# Patient Record
Sex: Female | Born: 1965
Health system: Southern US, Community
[De-identification: ages and names within clinical notes are randomized; demographics above are authoritative.]

## PROBLEM LIST (undated history)

## (undated) DIAGNOSIS — E079 Disorder of thyroid, unspecified: Secondary | ICD-10-CM

## (undated) DIAGNOSIS — T7840XA Allergy, unspecified, initial encounter: Secondary | ICD-10-CM

## (undated) DIAGNOSIS — E785 Hyperlipidemia, unspecified: Secondary | ICD-10-CM

## (undated) DIAGNOSIS — J329 Chronic sinusitis, unspecified: Secondary | ICD-10-CM

## (undated) DIAGNOSIS — K649 Unspecified hemorrhoids: Secondary | ICD-10-CM

## (undated) DIAGNOSIS — K219 Gastro-esophageal reflux disease without esophagitis: Secondary | ICD-10-CM

## (undated) DIAGNOSIS — I10 Essential (primary) hypertension: Secondary | ICD-10-CM

## (undated) DIAGNOSIS — J42 Unspecified chronic bronchitis: Secondary | ICD-10-CM

## (undated) HISTORY — DX: Gastro-esophageal reflux disease without esophagitis: K21.9

## (undated) HISTORY — DX: Allergy, unspecified, initial encounter: T78.40XA

## (undated) HISTORY — PX: TOTAL ABDOMINAL HYSTERECTOMY: SHX209

## (undated) HISTORY — DX: Essential (primary) hypertension: I10

## (undated) HISTORY — DX: Unspecified hemorrhoids: K64.9

## (undated) HISTORY — DX: Chronic sinusitis, unspecified: J32.9

## (undated) HISTORY — DX: Disorder of thyroid, unspecified: E07.9

## (undated) HISTORY — PX: OTHER SURGICAL HISTORY: SHX169

## (undated) HISTORY — PX: HEMORROIDECTOMY: SUR656

## (undated) HISTORY — DX: Unspecified chronic bronchitis: J42

## (undated) HISTORY — DX: Hyperlipidemia, unspecified: E78.5

---

## 2004-10-02 ENCOUNTER — Ambulatory Visit: Payer: Self-pay | Admitting: Family Medicine

## 2004-10-09 ENCOUNTER — Ambulatory Visit: Payer: Self-pay | Admitting: Family Medicine

## 2004-12-04 ENCOUNTER — Ambulatory Visit: Payer: Self-pay | Admitting: Family Medicine

## 2005-02-15 ENCOUNTER — Ambulatory Visit: Payer: Self-pay | Admitting: Family Medicine

## 2005-03-09 ENCOUNTER — Ambulatory Visit: Payer: Self-pay | Admitting: Family Medicine

## 2005-03-30 ENCOUNTER — Ambulatory Visit: Payer: Self-pay | Admitting: Family Medicine

## 2005-04-13 ENCOUNTER — Ambulatory Visit: Payer: Self-pay | Admitting: Family Medicine

## 2005-05-04 ENCOUNTER — Ambulatory Visit: Payer: Self-pay | Admitting: Family Medicine

## 2005-05-21 ENCOUNTER — Ambulatory Visit: Payer: Self-pay | Admitting: Family Medicine

## 2005-06-24 ENCOUNTER — Ambulatory Visit: Payer: Self-pay | Admitting: Family Medicine

## 2005-07-19 ENCOUNTER — Ambulatory Visit: Payer: Self-pay | Admitting: Family Medicine

## 2005-08-09 ENCOUNTER — Ambulatory Visit: Payer: Self-pay | Admitting: Family Medicine

## 2005-09-15 ENCOUNTER — Ambulatory Visit: Payer: Self-pay | Admitting: Family Medicine

## 2005-09-20 ENCOUNTER — Ambulatory Visit: Payer: Self-pay | Admitting: Internal Medicine

## 2007-11-14 ENCOUNTER — Encounter: Admission: RE | Admit: 2007-11-14 | Discharge: 2007-11-29 | Payer: Self-pay | Admitting: Orthopedic Surgery

## 2007-11-30 ENCOUNTER — Encounter: Admission: RE | Admit: 2007-11-30 | Discharge: 2008-01-05 | Payer: Self-pay | Admitting: Orthopedic Surgery

## 2010-10-15 ENCOUNTER — Encounter
Admission: RE | Admit: 2010-10-15 | Discharge: 2010-11-26 | Payer: Self-pay | Source: Home / Self Care | Attending: *Deleted | Admitting: *Deleted

## 2010-11-29 ENCOUNTER — Encounter
Admission: RE | Admit: 2010-11-29 | Discharge: 2010-12-29 | Payer: Self-pay | Source: Home / Self Care | Attending: *Deleted | Admitting: *Deleted

## 2011-04-16 NOTE — Consult Note (Signed)
Candice Jackson NO.:  [id number]   MEDICAL RECORD NO.:  [id number]           PATIENT TYPE:   LOCATION:                                 FACILITY:   PHYSICIAN:  R. Roetta Sessions, M.D. DATE OF BIRTH:  1966/09/28   DATE OF CONSULTATION:  09/20/2005  DATE OF DISCHARGE:                                   CONSULTATION   REASON FOR CONSULTATION:  Hemorrhoids.   HISTORY OF PRESENT ILLNESS:  The patient is a 45 year old Caucasian female  who presents today for further evaluation of constipation and hemorrhoids.  She has had constipation over several months duration.  She has been placed  on stool softener and fiber supplements with good results, and she was  having 2-3 soft stools daily.  However, during the summertime, when she was  having significant constipation, she was noting rectal pain due to  hemorrhoids.  She was also having hematochezia.  Continues to have  hematochezia at this point.  The last time she was blood per rectum was last  week.  The blood has been mixed in with stool.  Denies any melena.  Her  hemorrhoids did not seem to be quite as tender at this point.  She had used  various over-the-counter preparations as well as Anusol HC suppositories and  creams with decent results.  Denies any abdominal pain, nausea, vomiting,  heartburn, dysphagia, odynophagia.   MEDICATIONS:  1.  Tussionex one teaspoon q.h.s.  2.  Benicar 20 mg daily.  3.  Levaquin 750 mg daily.  4.  Stool softeners p.r.n.   ALLERGIES:  Ketek.   PAST MEDICAL HISTORY:  Hypertension, currently on Levaquin for sinusitis.   PAST SURGICAL HISTORY:  Hysterectomy.   FAMILY HISTORY:  Mother died of brain tumor age 27.  Father died of stroke  and heart disease age 60.  No family history of colorectal cancer.   SOCIAL HISTORY:  She is married and has two children.  She is employed with  [employer].  She smokes one pack of cigarettes daily.  She consumes about  three beers on the  weekend.   REVIEW OF SYSTEMS:  See HPI for GI.  CARDIOPULMONARY:  Denies any chest  pain, shortness of breath or palpitations.   PHYSICAL EXAMINATION:  VITAL SIGNS:  Weight 123, height 5 feet, temperature  97.7, blood pressure 118/64, pulse 74.  GENERAL APPEARANCE:  Pleasant, well-developed, well-nourished, Caucasian  female in no acute distress.  SKIN:  Warm and dry, no jaundice.  HEENT:  Pupils equal, round and reactive to light.  Conjunctivae are pink.  Sclerae are nonicteric. Oropharyngeal mucosa moist and pink.  No lesions,  erythema or exudate.  No lymphadenopathy, thyromegaly.  CHEST:  Lungs are clear to auscultation.  CARDIAC:  Regular rate and rhythm.  Normal S1, S2.  No murmurs, rubs or  gallops.  ABDOMEN:  Positive bowel sounds.  Soft, nontender, nondistended.  No  organomegaly or masses.  No rebound tenderness or guarding.  No abdominal  bruits or hernias.  EXTREMITIES:  No edema.   IMPRESSION:  Candice Jackson is a 45 year old lady with history of  constipation  associated with hematochezia and rectal pain, felt to be the hemorrhoids.  Her hematochezia may be based on a benign anal rectal source.  However,  cannot exclude other etiologies such as polyps, etc.  Given he chronicity of  her hematochezia, I offered her a colonoscopy at this time for further  evaluation.  With regards to her hemorrhoid pain, this is improved with  Anusol suppositories/creams and stool softeners.   PLAN:  1.  Colonoscopy in he near future.  2.  Recommended continuing stool softeners and fiber in order to keep her      bowel movements regular and soft.  3.  AnaMantle HC 2.5% gel, #10 samples provided to apply ano rectally b.i.d.      p.r.n.  4.  Further recommendations to follow.      Candice Jackson, P.AJonathon Bellows, M.D.  Electronically Signed    LL/MEDQ  D:  09/20/2005  T:  09/21/2005  Job:  [id number]

## 2012-03-27 ENCOUNTER — Encounter: Payer: Self-pay | Admitting: Pulmonary Disease

## 2012-03-28 ENCOUNTER — Ambulatory Visit (INDEPENDENT_AMBULATORY_CARE_PROVIDER_SITE_OTHER)
Admission: RE | Admit: 2012-03-28 | Discharge: 2012-03-28 | Disposition: A | Discharge status: Home / Self Care | Payer: BC Managed Care – PPO | Source: Ambulatory Visit | Attending: Pulmonary Disease | Admitting: Pulmonary Disease

## 2012-03-28 ENCOUNTER — Ambulatory Visit (INDEPENDENT_AMBULATORY_CARE_PROVIDER_SITE_OTHER): Payer: BC Managed Care – PPO | Admitting: Pulmonary Disease

## 2012-03-28 ENCOUNTER — Encounter: Payer: Self-pay | Admitting: Pulmonary Disease

## 2012-03-28 ENCOUNTER — Encounter: Payer: Self-pay | Admitting: *Deleted

## 2012-03-28 VITALS — BP 124/82 | HR 96 | Temp 97.8°F | Ht 60.0 in | Wt 139.4 lb

## 2012-03-28 DIAGNOSIS — J45909 Unspecified asthma, uncomplicated: Secondary | ICD-10-CM

## 2012-03-28 NOTE — Patient Instructions (Signed)
Will send for a cxr today, and will call you with results Will set up for breathing studies, and see you back same day to review.  Depending upon results, may need to start you on an inhaler. Work hard on smoking cessation.  This is the key to you staying well.

## 2012-03-28 NOTE — Progress Notes (Signed)
Addended by: Salli Quarry on: 03/28/2012 10:14 AM   Modules accepted: Orders

## 2012-03-28 NOTE — Assessment & Plan Note (Signed)
The patient is describing recurrent episodes of asthmatic bronchitis that required an antibiotic and course of prednisone.  I suspect all of this is due to her ongoing smoking, although chronic sinus disease could be playing a role.  It is unclear whether she has fixed obstructive lung disease, and will need full pulmonary function studies for evaluation.  She really does not have a lot of dyspnea on exertion.  I have stressed to her the role that ongoing smoking plays in airway inflammation, and how it predisposes her to recurrent sinopulmonary infections.  She is likely to have ongoing issues unless she can totally stop smoking.  Will check a chest x-ray for completeness, and see her back after her pulmonary function studies to discuss further treatments.

## 2012-03-28 NOTE — Progress Notes (Signed)
  Subjective:    Patient ID: Candice Jackson, female    DOB: 07-Jun-1966, 46 y.o.   MRN: [id number]  HPI The patient is a 46 year old female who been asked to see for recurrent sinopulmonary infections.  These have been going on for approximately 2 years, with the last requiring antibiotics and prednisone in about 4 weeks ago.  These always start in her head, and then moves down into her chest.  They have been occurring approximately every 2 months.  It should be noted the patient has a long history of tobacco abuse, and continues to smoke.  She has a chronic cough with white foamy mucus production when she is not sick.  Surprisingly, she does not have a lot of dyspnea on exertion.  She can bring groceries in from the car, do her housework, and walk up one flight of stairs without becoming winded.  She has no past history of asthma.  She tells me that she had a sinus x-ray approximately 5 years ago and it was clear.  She has a history of reflux for which she is on medication with adequate control of symptoms.  She has not had a chest x-ray or pulmonary function studies.   Review of Systems  Constitutional: Negative for fever and unexpected weight change.  HENT: Positive for ear pain. Negative for nosebleeds, congestion, sore throat, rhinorrhea, sneezing, trouble swallowing, dental problem, postnasal drip and sinus pressure.   Eyes: Negative for redness and itching.  Respiratory: Positive for cough and shortness of breath. Negative for chest tightness and wheezing.   Cardiovascular: Negative for palpitations and leg swelling.  Gastrointestinal: Negative for nausea and vomiting.  Genitourinary: Negative for dysuria.  Musculoskeletal: Negative for joint swelling.  Skin: Positive for rash.  Neurological: Negative for headaches.  Hematological: Does not bruise/bleed easily.  Psychiatric/Behavioral: Negative for dysphoric mood. The patient is nervous/anxious.        Objective:   Physical  Exam Constitutional:  Well developed, no acute distress  HENT:  Nares patent without discharge, ?polyp left nare  Oropharynx without exudate, palate and uvula are normal  Eyes:  Perrla, eomi, no scleral icterus  Neck:  No JVD, no TMG  Cardiovascular:  Normal rate, regular rhythm, no rubs or gallops.  No murmurs        Intact distal pulses  Pulmonary :  Normal breath sounds, no stridor or respiratory distress   No rales, rhonchi, or wheezing  Abdominal:  Soft, nondistended, bowel sounds present.  No tenderness noted.   Musculoskeletal:  No lower extremity edema noted.  Lymph Nodes:  No cervical lymphadenopathy noted  Skin:  No cyanosis noted  Neurologic:  Alert, appropriate, moves all 4 extremities without obvious deficit.         Assessment & Plan:

## 2012-03-30 ENCOUNTER — Telehealth: Payer: Self-pay | Admitting: Pulmonary Disease

## 2012-03-30 NOTE — Telephone Encounter (Signed)
Notes Recorded by Barbaraann Share, MD on 03/28/2012 at 5:14 PM Please let pt know that cxr is clear.   I spoke with patient about results and she verbalized understanding and had no questions

## 2012-04-12 ENCOUNTER — Encounter: Payer: Self-pay | Admitting: Pulmonary Disease

## 2012-04-12 ENCOUNTER — Encounter (INDEPENDENT_AMBULATORY_CARE_PROVIDER_SITE_OTHER): Payer: BC Managed Care – PPO | Admitting: Pulmonary Disease

## 2012-04-12 ENCOUNTER — Ambulatory Visit (INDEPENDENT_AMBULATORY_CARE_PROVIDER_SITE_OTHER): Payer: BC Managed Care – PPO | Admitting: Pulmonary Disease

## 2012-04-12 VITALS — BP 112/74 | HR 102 | Temp 97.9°F | Ht 60.0 in | Wt 138.0 lb

## 2012-04-12 DIAGNOSIS — R0602 Shortness of breath: Secondary | ICD-10-CM

## 2012-04-12 DIAGNOSIS — J45909 Unspecified asthma, uncomplicated: Secondary | ICD-10-CM

## 2012-04-12 LAB — PULMONARY FUNCTION TEST

## 2012-04-12 NOTE — Assessment & Plan Note (Signed)
The patient has normal PFTs today, and therefore does not have COPD.  I have told her that if she is able to totally stop smoking, she will not require any inhaler.  However, she continues to smoke, I expect her to have ongoing sinopulmonary infections on a frequent basis.  If she is unable to quit smoking, could consider a LABA/ICS inhaler in order to reduce airway inflammation and possibly decrease her flareups.  I will leave that to the patient and her primary care physician.  The patient needs no further pulmonary followup at this time.

## 2012-04-12 NOTE — Progress Notes (Signed)
  Subjective:    Patient ID: Candice Jackson, female    DOB: [birth year], 46 y.o.   MRN: [id number]  HPI Patient comes in today for followup of her recent pulmonary function studies.  She was found to have no airflow obstruction, no restrictions, and essentially normal diffusion capacity.  Her chest x-ray last visit was clear as well.  I had a long discussion with the patient about her breathing studies, and answered all of her questions.   Review of Systems  Constitutional: Negative.  Negative for fever and unexpected weight change.  HENT: Positive for congestion. Negative for ear pain, nosebleeds, sore throat, rhinorrhea, sneezing, trouble swallowing, dental problem, postnasal drip and sinus pressure.   Eyes: Negative.  Negative for redness and itching.  Respiratory: Positive for cough and wheezing. Negative for chest tightness and shortness of breath.   Cardiovascular: Negative.  Negative for palpitations and leg swelling.  Gastrointestinal: Negative.  Negative for nausea and vomiting.  Genitourinary: Negative.  Negative for dysuria.  Musculoskeletal: Negative.  Negative for joint swelling.  Skin: Negative.  Negative for rash.  Neurological: Negative.  Negative for headaches.  Hematological: Negative.  Does not bruise/bleed easily.  Psychiatric/Behavioral: Negative.  Negative for dysphoric mood. The patient is not nervous/anxious.        Objective:   Physical Exam Well-developed female in no acute distress Nose without purulence or discharge noted Chest clear to auscultation Cardiac exam with regular rate and rhythm Lower extremities without edema, no cyanosis Alert and oriented, moves all 4 extremities.       Assessment & Plan:

## 2012-04-12 NOTE — Patient Instructions (Signed)
The good news is that you do not have copd. You must stop smoking if you wish to stay well. You may benefit from an inhaler such as symbicort or dulera if you are unable to stop smoking and keep having flareups.  I will leave that decision to your primary md.  If you stop smoking, you will not need any breathing medication. followup with me as needed.

## 2015-03-04 ENCOUNTER — Ambulatory Visit: Payer: Self-pay

## 2015-03-04 ENCOUNTER — Other Ambulatory Visit: Payer: Self-pay | Admitting: Occupational Medicine

## 2015-03-04 DIAGNOSIS — M25531 Pain in right wrist: Secondary | ICD-10-CM

## 2015-03-04 DIAGNOSIS — M79641 Pain in right hand: Secondary | ICD-10-CM

## 2016-08-05 ENCOUNTER — Other Ambulatory Visit: Payer: Self-pay | Admitting: Physician Assistant

## 2016-08-16 ENCOUNTER — Other Ambulatory Visit: Payer: Self-pay | Admitting: Physician Assistant

## 2018-06-15 ENCOUNTER — Other Ambulatory Visit (HOSPITAL_COMMUNITY): Payer: Self-pay | Admitting: Pulmonary Disease

## 2018-06-15 ENCOUNTER — Ambulatory Visit (HOSPITAL_COMMUNITY)
Admission: RE | Admit: 2018-06-15 | Discharge: 2018-06-15 | Disposition: A | Discharge status: Home / Self Care | Payer: 59 | Source: Ambulatory Visit | Attending: Pulmonary Disease | Admitting: Pulmonary Disease

## 2018-06-15 DIAGNOSIS — R918 Other nonspecific abnormal finding of lung field: Secondary | ICD-10-CM | POA: Insufficient documentation

## 2018-06-15 DIAGNOSIS — R05 Cough: Secondary | ICD-10-CM

## 2018-06-15 DIAGNOSIS — R059 Cough, unspecified: Secondary | ICD-10-CM

## 2018-06-15 DIAGNOSIS — J9811 Atelectasis: Secondary | ICD-10-CM | POA: Insufficient documentation

## 2018-08-07 DIAGNOSIS — I10 Essential (primary) hypertension: Secondary | ICD-10-CM | POA: Insufficient documentation

## 2018-08-07 DIAGNOSIS — E039 Hypothyroidism, unspecified: Secondary | ICD-10-CM | POA: Insufficient documentation

## 2018-08-07 DIAGNOSIS — J449 Chronic obstructive pulmonary disease, unspecified: Secondary | ICD-10-CM | POA: Insufficient documentation

## 2018-08-07 DIAGNOSIS — K219 Gastro-esophageal reflux disease without esophagitis: Secondary | ICD-10-CM | POA: Insufficient documentation

## 2018-09-15 ENCOUNTER — Ambulatory Visit (INDEPENDENT_AMBULATORY_CARE_PROVIDER_SITE_OTHER): Payer: 59 | Admitting: Internal Medicine

## 2018-09-25 ENCOUNTER — Encounter (INDEPENDENT_AMBULATORY_CARE_PROVIDER_SITE_OTHER): Payer: Self-pay | Admitting: Internal Medicine

## 2018-09-25 ENCOUNTER — Ambulatory Visit (INDEPENDENT_AMBULATORY_CARE_PROVIDER_SITE_OTHER): Payer: 59 | Admitting: Internal Medicine

## 2018-09-25 VITALS — BP 160/80 | HR 64 | Temp 98.1°F | Ht 60.0 in | Wt 129.6 lb

## 2018-09-25 DIAGNOSIS — K219 Gastro-esophageal reflux disease without esophagitis: Secondary | ICD-10-CM | POA: Diagnosis not present

## 2018-09-25 NOTE — Patient Instructions (Addendum)
Gastroesophageal Reflux Disease, Adult Normally, food travels down the esophagus and stays in the stomach to be digested. If a person has gastroesophageal reflux disease (GERD), food and stomach acid move back up into the esophagus. When this happens, the esophagus becomes sore and swollen (inflamed). Over time, GERD can make small holes (ulcers) in the lining of the esophagus. Follow these instructions at home: Diet  Follow a diet as told by your doctor. You may need to avoid foods and drinks such as: ? Coffee and tea (with or without caffeine). ? Drinks that contain alcohol. ? Energy drinks and sports drinks. ? Carbonated drinks or sodas. ? Chocolate and cocoa. ? Peppermint and mint flavorings. ? Garlic and onions. ? Horseradish. ? Spicy and acidic foods, such as peppers, chili powder, curry powder, vinegar, hot sauces, and BBQ sauce. ? Citrus fruit juices and citrus fruits, such as oranges, lemons, and limes. ? Tomato-based foods, such as red sauce, chili, salsa, and pizza with red sauce. ? Fried and fatty foods, such as donuts, french fries, potato chips, and high-fat dressings. ? High-fat meats, such as hot dogs, rib eye steak, sausage, ham, and bacon. ? High-fat dairy items, such as whole milk, butter, and cream cheese.  Eat small meals often. Avoid eating large meals.  Avoid drinking large amounts of liquid with your meals.  Avoid eating meals during the 2-3 hours before bedtime.  Avoid lying down right after you eat.  Do not exercise right after you eat. General instructions  Pay attention to any changes in your symptoms.  Take over-the-counter and prescription medicines only as told by your doctor. Do not take aspirin, ibuprofen, or other NSAIDs unless your doctor says it is okay.  Do not use any tobacco products, including cigarettes, chewing tobacco, and e-cigarettes. If you need help quitting, ask your doctor.  Wear loose clothes. Do not wear anything tight around  your waist.  Raise (elevate) the head of your bed about 6 inches (15 cm).  Try to lower your stress. If you need help doing this, ask your doctor.  If you are overweight, lose an amount of weight that is healthy for you. Ask your doctor about a safe weight loss goal.  Keep all follow-up visits as told by your doctor. This is important. Contact a doctor if:  You have new symptoms.  You lose weight and you do not know why it is happening.  You have trouble swallowing, or it hurts to swallow.  You have wheezing or a cough that keeps happening.  Your symptoms do not get better with treatment.  You have a hoarse voice. Get help right away if:  You have pain in your arms, neck, jaw, teeth, or back.  You feel sweaty, dizzy, or light-headed.  You have chest pain or shortness of breath.  You throw up (vomit) and your throw up looks like blood or coffee grounds.  You pass out (faint).  Your poop (stool) is bloody or black.  You cannot swallow, drink, or eat. This information is not intended to replace advice given to you by your health care provider. Make sure you discuss any questions you have with your health care provider. Document Released: 05/03/2008 Document Revised: 04/22/2016 Document Reviewed: 03/12/2015 Elsevier Interactive Patient Education  [employer].   Stop the Motrin. Omeprazole 20mg  BID.

## 2018-09-25 NOTE — Progress Notes (Signed)
   Subjective:    Patient ID: Candice Jackson, female    DOB: Feb 12, 1966, 52 y.o.   MRN: [id number]  HPI Referred by Hinda Glatter PA for nausea. She states hasn't been able to keep anything on her stomach. She says she feels better since starting the Omeprazole. She was taking Omeprazole twice a day, but now is down to once a day. She is 80% better.  GERD controlled with Omeprazole. She recently had her Synthroid increased. States after the increase, she began to feel better.   Has been taking Motrin 600mg  hs for a long time. Her appetite is better. No weight loss.  She has a BM x 1 a day. No melena or BRRB. She says she cannot eat eggs now. She says she will vomit the eggs back up. She denies any epigastric pain.   08/07/2018 H and H 132. And 38.5.   Review of Systems Past Medical History:  Diagnosis Date  . Allergic rhinitis   . Constipation   . GERD (gastroesophageal reflux disease)   . Hemorrhoid   . HTN (hypertension)   . Hyperlipidemia   . Sinusitis     Past Surgical History:  Procedure Laterality Date  . HEMORROIDECTOMY    . R ankle surgery    . TOTAL ABDOMINAL HYSTERECTOMY      Allergies  Allergen Reactions  . Amoxicillin-Pot Clavulanate Nausea And Vomiting  . Ketek [Telithromycin] Palpitations  . Statins Rash    Current Outpatient Medications on File Prior to Visit  Medication Sig Dispense Refill  . albuterol (PROAIR HFA) 108 (90 BASE) MCG/ACT inhaler Inhale 2 puffs into the lungs every 6 (six) hours as needed.    Marland Kitchen ibuprofen (ADVIL,MOTRIN) 600 MG tablet Take 600 mg by mouth at bedtime.    Marland Kitchen levothyroxine (SYNTHROID) 125 MCG tablet Take 125 mcg by mouth daily before breakfast.    . Nebivolol HCl (BYSTOLIC) 20 MG TABS Take by mouth.    Marland Kitchen omeprazole (PRILOSEC) 20 MG capsule Take 20 mg by mouth daily.    . SYMBICORT 80-4.5 MCG/ACT inhaler USE 2 INHALATIONS TWICE A DAY 30.6 g 3   No current facility-administered medications on file prior to visit.           Objective:   Physical Exam  Blood pressure (!) 160/80, pulse 64, temperature 98.1 F (36.7 C), height 5' (1.524 m), weight 129 lb 9.6 oz (58.8 kg). Alert and oriented. Skin warm and dry. Oral mucosa is moist.   . Sclera anicteric, conjunctivae is pink. Thyroid not enlarged. No cervical lymphadenopathy. Lungs clear. Heart regular rate and rhythm.  Abdomen is soft. Bowel sounds are positive. No hepatomegaly. No abdominal masses felt. No tenderness.  No edema to lower extremities.          Assessment & Plan:  GERD. She will continue the Omeprazole twice a day. Stop the Motrin.  OV in 3 months.

## 2018-12-26 ENCOUNTER — Ambulatory Visit (INDEPENDENT_AMBULATORY_CARE_PROVIDER_SITE_OTHER): Payer: 59 | Admitting: Internal Medicine

## 2018-12-26 ENCOUNTER — Encounter (INDEPENDENT_AMBULATORY_CARE_PROVIDER_SITE_OTHER): Payer: Self-pay | Admitting: Internal Medicine

## 2018-12-26 ENCOUNTER — Encounter (INDEPENDENT_AMBULATORY_CARE_PROVIDER_SITE_OTHER): Payer: Self-pay | Admitting: *Deleted

## 2018-12-26 VITALS — BP 144/86 | HR 80 | Temp 98.0°F | Ht 60.0 in | Wt 127.4 lb

## 2018-12-26 DIAGNOSIS — R112 Nausea with vomiting, unspecified: Secondary | ICD-10-CM | POA: Diagnosis not present

## 2018-12-26 NOTE — Patient Instructions (Signed)
Will get an US abdomen, CBC and Hepatic.

## 2018-12-26 NOTE — Progress Notes (Signed)
   Subjective:    Patient ID: Candice Jackson, female    DOB: 12-09-1965, 53 y.o.   MRN: [id number]  HPI Here today for f/u. Last seen in October as a new referral for nausea. Started to feel better after starting the Omeprazole. She was 80% better. Also stated once her Synthroid was increased she felt better. She tells me she vomits everyday.  She does have acid reflux. She wants to be switched to the brand name for Omeprazole.  Her appetite is okay. She has lost about 1 1/2 pounds. She vomits after each meal. She says however she can eat junk foods and snack and does not vomit. She can drink beer without any problem. She drinks about 5-6 beers a day.  No problems with her BMs.  Takes one Motrin 600mg  at hs for left shoulder pain (arthritis)    Review of Systems Past Medical History:  Diagnosis Date  . Allergic rhinitis   . Constipation   . GERD (gastroesophageal reflux disease)   . Hemorrhoid   . HTN (hypertension)   . Hyperlipidemia   . Sinusitis     Past Surgical History:  Procedure Laterality Date  . HEMORROIDECTOMY    . R ankle surgery    . TOTAL ABDOMINAL HYSTERECTOMY      Allergies  Allergen Reactions  . Amoxicillin-Pot Clavulanate Nausea And Vomiting  . Ketek [Telithromycin] Palpitations  . Statins Rash    Current Outpatient Medications on File Prior to Visit  Medication Sig Dispense Refill  . albuterol (PROAIR HFA) 108 (90 BASE) MCG/ACT inhaler Inhale 2 puffs into the lungs every 6 (six) hours as needed.    Marland Kitchen ibuprofen (ADVIL,MOTRIN) 600 MG tablet Take 600 mg by mouth at bedtime.    Marland Kitchen levothyroxine (SYNTHROID) 125 MCG tablet Take 125 mcg by mouth daily before breakfast.    . Nebivolol HCl (BYSTOLIC) 20 MG TABS Take by mouth.    Marland Kitchen omeprazole (PRILOSEC) 20 MG capsule Take 20 mg by mouth daily.    . SYMBICORT 80-4.5 MCG/ACT inhaler USE 2 INHALATIONS TWICE A DAY 30.6 g 3   No current facility-administered medications on file prior to visit.           Objective:   Physical Exam Blood pressure (!) 144/86, pulse 80, temperature 98 F (36.7 C), height 5' (1.524 m), weight 127 lb 6.4 oz (57.8 kg). Alert and oriented. Skin warm and dry. Oral mucosa is moist.   . Sclera anicteric, conjunctivae is pink. Thyroid not enlarged. No cervical lymphadenopathy. Lungs clear. Heart regular rate and rhythm.  Abdomen is soft. Bowel sounds are positive. No hepatomegaly. No abdominal masses felt. No tenderness.  No edema to lower extremities.           Assessment & Plan:  Nausea and vomiting. Am going to get a CBC, Hepatic, and an US abdomen.  Further recommendations to follow. May need a HIDA scan.

## 2018-12-27 LAB — HEPATIC FUNCTION PANEL
AG RATIO: 1.5 (calc) (ref 1.0–2.5)
ALT: 25 U/L (ref 6–29)
AST: 22 U/L (ref 10–35)
Albumin: 4.4 g/dL (ref 3.6–5.1)
Alkaline phosphatase (APISO): 91 U/L (ref 33–130)
BILIRUBIN TOTAL: 0.5 mg/dL (ref 0.2–1.2)
Bilirubin, Direct: 0.1 mg/dL (ref 0.0–0.2)
GLOBULIN: 3 g/dL (ref 1.9–3.7)
Indirect Bilirubin: 0.4 mg/dL (calc) (ref 0.2–1.2)
TOTAL PROTEIN: 7.4 g/dL (ref 6.1–8.1)

## 2018-12-27 LAB — CBC WITH DIFFERENTIAL/PLATELET
Absolute Monocytes: 599 cells/uL (ref 200–950)
BASOS ABS: 68 {cells}/uL (ref 0–200)
Basophils Relative: 1.2 %
EOS ABS: 228 {cells}/uL (ref 15–500)
Eosinophils Relative: 4 %
HCT: 40.8 % (ref 35.0–45.0)
Hemoglobin: 14.2 g/dL (ref 11.7–15.5)
Lymphs Abs: 2423 cells/uL (ref 850–3900)
MCH: 31.8 pg (ref 27.0–33.0)
MCHC: 34.8 g/dL (ref 32.0–36.0)
MCV: 91.3 fL (ref 80.0–100.0)
MONOS PCT: 10.5 %
MPV: 9.9 fL (ref 7.5–12.5)
NEUTROS PCT: 41.8 %
Neutro Abs: 2383 cells/uL (ref 1500–7800)
Platelets: 264 10*3/uL (ref 140–400)
RBC: 4.47 10*6/uL (ref 3.80–5.10)
RDW: 12.7 % (ref 11.0–15.0)
TOTAL LYMPHOCYTE: 42.5 %
WBC: 5.7 10*3/uL (ref 3.8–10.8)

## 2019-01-01 ENCOUNTER — Ambulatory Visit (HOSPITAL_COMMUNITY)
Admission: RE | Admit: 2019-01-01 | Discharge: 2019-01-01 | Disposition: A | Discharge status: Home / Self Care | Payer: 59 | Source: Ambulatory Visit | Attending: Internal Medicine | Admitting: Internal Medicine

## 2019-01-01 DIAGNOSIS — R112 Nausea with vomiting, unspecified: Secondary | ICD-10-CM

## 2019-01-23 ENCOUNTER — Encounter (INDEPENDENT_AMBULATORY_CARE_PROVIDER_SITE_OTHER): Payer: Self-pay | Admitting: *Deleted

## 2019-01-29 ENCOUNTER — Telehealth (INDEPENDENT_AMBULATORY_CARE_PROVIDER_SITE_OTHER): Payer: Self-pay | Admitting: Internal Medicine

## 2019-01-29 NOTE — Telephone Encounter (Signed)
Patient called regarding CT results  -  Ph# [phone]

## 2019-01-31 ENCOUNTER — Telehealth (INDEPENDENT_AMBULATORY_CARE_PROVIDER_SITE_OTHER): Payer: Self-pay | Admitting: Internal Medicine

## 2019-01-31 DIAGNOSIS — K7469 Other cirrhosis of liver: Secondary | ICD-10-CM

## 2019-01-31 DIAGNOSIS — R112 Nausea with vomiting, unspecified: Secondary | ICD-10-CM

## 2019-01-31 NOTE — Telephone Encounter (Signed)
Ann, CT abdomen. ] N and Vomiting cirrhosis

## 2019-01-31 NOTE — Telephone Encounter (Signed)
Message left on answering machine 

## 2019-01-31 NOTE — Telephone Encounter (Signed)
Results given to patient. CT ordered

## 2019-01-31 NOTE — Telephone Encounter (Signed)
Patient called regarding results from her [place]

## 2019-02-01 ENCOUNTER — Telehealth (INDEPENDENT_AMBULATORY_CARE_PROVIDER_SITE_OTHER): Payer: Self-pay | Admitting: Internal Medicine

## 2019-02-01 ENCOUNTER — Other Ambulatory Visit (INDEPENDENT_AMBULATORY_CARE_PROVIDER_SITE_OTHER): Payer: Self-pay | Admitting: Internal Medicine

## 2019-02-01 ENCOUNTER — Telehealth (INDEPENDENT_AMBULATORY_CARE_PROVIDER_SITE_OTHER): Payer: Self-pay | Admitting: *Deleted

## 2019-02-01 DIAGNOSIS — K7469 Other cirrhosis of liver: Secondary | ICD-10-CM

## 2019-02-01 DIAGNOSIS — R112 Nausea with vomiting, unspecified: Secondary | ICD-10-CM

## 2019-02-01 NOTE — Telephone Encounter (Signed)
CT abdomen/w/CM ordered

## 2019-02-01 NOTE — Telephone Encounter (Signed)
Please call patient -- she has questions on what she can and cannot drink or eat for her liver

## 2019-02-01 NOTE — Telephone Encounter (Signed)
CT sch'd 02/13/19 at 1245, npo 4 hrs prior, pick up contrast, patient aware

## 2019-02-01 NOTE — Telephone Encounter (Signed)
I advised her to etoh

## 2019-02-01 NOTE — Addendum Note (Signed)
Addended by: Len Blalock on: 02/01/2019 10:58 AM   Modules accepted: Orders

## 2019-02-13 ENCOUNTER — Other Ambulatory Visit: Payer: Self-pay

## 2019-02-13 ENCOUNTER — Ambulatory Visit (HOSPITAL_COMMUNITY)
Admission: RE | Admit: 2019-02-13 | Discharge: 2019-02-13 | Disposition: A | Discharge status: Home / Self Care | Payer: 59 | Source: Ambulatory Visit | Attending: Internal Medicine | Admitting: Internal Medicine

## 2019-02-13 DIAGNOSIS — K7469 Other cirrhosis of liver: Secondary | ICD-10-CM | POA: Diagnosis present

## 2019-02-13 DIAGNOSIS — R112 Nausea with vomiting, unspecified: Secondary | ICD-10-CM | POA: Insufficient documentation

## 2019-02-13 LAB — POCT I-STAT CREATININE: Creatinine, Ser: 0.8 mg/dL (ref 0.44–1.00)

## 2019-02-13 MED ORDER — IOHEXOL 300 MG/ML  SOLN
100.0000 mL | Freq: Once | INTRAMUSCULAR | Status: AC | PRN
  Administered 2019-02-13: 100 mL via INTRAVENOUS

## 2019-10-01 ENCOUNTER — Ambulatory Visit (INDEPENDENT_AMBULATORY_CARE_PROVIDER_SITE_OTHER): Payer: 59 | Admitting: Family Medicine

## 2019-10-01 ENCOUNTER — Other Ambulatory Visit: Payer: Self-pay

## 2019-10-01 ENCOUNTER — Encounter: Payer: Self-pay | Admitting: Family Medicine

## 2019-10-01 VITALS — BP 118/77 | HR 72 | Temp 98.0°F | Resp 20 | Ht 60.0 in | Wt 130.0 lb

## 2019-10-01 DIAGNOSIS — I10 Essential (primary) hypertension: Secondary | ICD-10-CM

## 2019-10-01 DIAGNOSIS — F411 Generalized anxiety disorder: Secondary | ICD-10-CM

## 2019-10-01 DIAGNOSIS — E782 Mixed hyperlipidemia: Secondary | ICD-10-CM

## 2019-10-01 DIAGNOSIS — J418 Mixed simple and mucopurulent chronic bronchitis: Secondary | ICD-10-CM | POA: Diagnosis not present

## 2019-10-01 DIAGNOSIS — E039 Hypothyroidism, unspecified: Secondary | ICD-10-CM | POA: Diagnosis not present

## 2019-10-01 DIAGNOSIS — R21 Rash and other nonspecific skin eruption: Secondary | ICD-10-CM

## 2019-10-01 DIAGNOSIS — M25561 Pain in right knee: Secondary | ICD-10-CM

## 2019-10-01 DIAGNOSIS — K219 Gastro-esophageal reflux disease without esophagitis: Secondary | ICD-10-CM

## 2019-10-01 MED ORDER — BUSPIRONE HCL 5 MG PO TABS: 5.0000 mg | ORAL_TABLET | Freq: Three times a day (TID) | ORAL | 3 refills | Status: AC | PRN

## 2019-10-01 MED ORDER — LEVOTHYROXINE SODIUM 125 MCG PO TABS: 125.0000 ug | ORAL_TABLET | Freq: Every day | ORAL | 3 refills | Status: DC

## 2019-10-01 MED ORDER — DICLOFENAC SODIUM 1 % TD GEL: 4.0000 g | Freq: Four times a day (QID) | TRANSDERMAL | 1 refills | Status: DC

## 2019-10-01 MED ORDER — OMEPRAZOLE 20 MG PO CPDR: 20.0000 mg | DELAYED_RELEASE_CAPSULE | Freq: Every day | ORAL | 5 refills | Status: DC

## 2019-10-01 MED ORDER — NYSTATIN-TRIAMCINOLONE 100000-0.1 UNIT/GM-% EX OINT: 1.0000 "application " | TOPICAL_OINTMENT | Freq: Two times a day (BID) | CUTANEOUS | 0 refills | Status: DC

## 2019-10-01 MED ORDER — BYSTOLIC 20 MG PO TABS: 20.0000 mg | ORAL_TABLET | Freq: Every day | ORAL | 5 refills | Status: DC

## 2019-10-01 MED ORDER — SPIRIVA RESPIMAT 1.25 MCG/ACT IN AERS: 1.0000 | INHALATION_SPRAY | Freq: Every day | RESPIRATORY_TRACT | 11 refills | Status: DC

## 2019-10-01 MED ORDER — ALBUTEROL SULFATE HFA 108 (90 BASE) MCG/ACT IN AERS: 2.0000 | INHALATION_SPRAY | Freq: Four times a day (QID) | RESPIRATORY_TRACT | 11 refills | Status: AC | PRN

## 2019-10-01 NOTE — Progress Notes (Signed)
Subjective:  Patient ID: Candice Jackson, female    DOB: 05/13/1966, 53 y.o.   MRN: [id number]  Patient Care Team: Baruch Gouty, FNP as PCP - General (Family Medicine)   Chief Complaint:  Establish Care (NEW )   HPI: Candice Jackson is a 53 y.o. female presenting on 10/01/2019 for Establish Care (NEW )  Pt presents today to establish care. Pt was previously seeing Dr. Edrick Oh who has retired.   Hyperlipidemia This is a chronic problem. The current episode started more than 1 year ago. The problem is uncontrolled. Recent lipid tests were reviewed and are high. Associated symptoms include shortness of breath (exertional). Pertinent negatives include no chest pain, focal sensory loss, focal weakness, leg pain or myalgias. Treatments tried: unable to tolerate statin therapy, not taking anything. Compliance problems include adherence to diet, adherence to exercise and medication side effects.   Hypertension This is a chronic problem. The current episode started more than 1 year ago. The problem is unchanged. The problem is controlled. Associated symptoms include anxiety and shortness of breath (exertional). Pertinent negatives include no blurred vision, chest pain, headaches, malaise/fatigue, neck pain, orthopnea, palpitations, peripheral edema, PND or sweats. 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 The current treatment provides significant improvement. Compliance problems include exercise and diet.  Identifiable causes of hypertension include a thyroid problem.  Thyroid Problem Presents for follow-up visit. Symptoms include cold intolerance. Patient reports no anxiety, constipation, depressed mood, diaphoresis, diarrhea, dry skin, fatigue, hair loss, heat intolerance, hoarse voice, leg swelling, nail problem, palpitations, tremors, visual change, weight gain or weight loss. Her past medical history is significant for hyperlipidemia.  Anxiety Presents for follow-up visit. Symptoms include shortness of  breath (exertional). Patient reports no chest pain, confusion, depressed mood, nausea, nervous/anxious behavior, palpitations or suicidal ideas. Symptoms occur rarely (only takes Xanax as needed for acute anxiety. Has not been on daily medications. Has not needed Xanax in over a year.). The severity of symptoms is mild. The quality of sleep is good. Nighttime awakenings: none.    Cough This is a chronic problem. The current episode started more than 1 year ago. The problem has been waxing and waning. The problem occurs every few hours (was provided with Symbicort and did not tolerate this medication.). The cough is productive of sputum. Associated symptoms include a rash, shortness of breath (exertional) and wheezing. Pertinent negatives include no chest pain, chills, ear congestion, ear pain, fever, headaches, heartburn, hemoptysis, myalgias, nasal congestion, postnasal drip, rhinorrhea, sore throat, sweats or weight loss. Risk factors for lung disease include smoking/tobacco exposure. She has tried a beta-agonist inhaler and steroid inhaler for the symptoms. The treatment provided mild relief. Her past medical history is significant for bronchitis and COPD.  Gastroesophageal Reflux She complains of coughing and wheezing. She reports no abdominal pain, no belching, no chest pain, no choking, no dysphagia, no early satiety, no globus sensation, no heartburn, no hoarse voice, no nausea, no sore throat, no stridor, no tooth decay or no water brash. This is a chronic problem. The current episode started more than 1 year ago. The problem occurs rarely. The problem has been resolved. Pertinent negatives include no anemia, fatigue, melena, muscle weakness, orthopnea or weight loss. She has tried a PPI for the symptoms. The treatment provided significant relief.  Rash This is a new problem. The current episode started more than 1 month ago. The affected locations include the left hand. The rash is characterized by  scaling, redness and peeling. She  was exposed to nothing. Associated symptoms include coughing, joint pain and shortness of breath (exertional). Pertinent negatives include no anorexia, congestion, diarrhea, eye pain, facial edema, fatigue, fever, nail changes, rhinorrhea, sore throat or vomiting. Past treatments include antibiotics and anti-itch cream. The treatment provided no relief.  Knee Pain  The incident occurred more than 1 week ago. There was no injury mechanism. The pain is present in the right knee. The quality of the pain is described as aching. The pain is at a severity of 3/10. The pain is mild. The pain has been intermittent since onset. Pertinent negatives include no inability to bear weight, loss of motion, loss of sensation, muscle weakness, numbness or tingling. She reports no foreign bodies present. The symptoms are aggravated by movement and weight bearing. She has tried nothing for the symptoms.     Relevant past medical, surgical, family, and social history reviewed and updated as indicated.  Allergies and medications reviewed and updated. Date reviewed: Chart in Epic.   Past Medical History:  Diagnosis Date  . Allergic rhinitis   . Allergy   . Chronic bronchitis ([place])   . Constipation   . GERD (gastroesophageal reflux disease)   . Hemorrhoid   . HTN (hypertension)   . Hyperlipidemia   . Sinusitis   . Thyroid disease     Past Surgical History:  Procedure Laterality Date  . HEMORROIDECTOMY    . R ankle surgery    . TOTAL ABDOMINAL HYSTERECTOMY      Social History   Socioeconomic History  . Marital status: Married    Spouse name: [name]  . Number of children: 2  . Years of education: Not on file  . Highest education level: Not on file  Occupational History  . Occupation: Therapist, music: [employer]  . Financial resource strain: Not on file  . Food insecurity    Worry: Not on file    Inability: Not on file  .  Transportation needs    Medical: Not on file    Non-medical: Not on file  Tobacco Use  . Smoking status: Current Every Day Smoker    Packs/day: 0.80    Years: 20.00    Pack years: 16.00    Types: Cigarettes  . Smokeless tobacco: Never Used  Substance and Sexual Activity  . Alcohol use: Yes    Comment: 3-4 beers a night  . Drug use: Never  . Sexual activity: Not on file  Lifestyle  . Physical activity    Days per week: Not on file    Minutes per session: Not on file  . Stress: Not on file  Relationships  . Social Herbalist on phone: Not on file    Gets together: Not on file    Attends religious service: Not on file    Active member of club or organization: Not on file    Attends meetings of clubs or organizations: Not on file    Relationship status: Not on file  . Intimate partner violence    Fear of current or ex partner: Not on file    Emotionally abused: Not on file    Physically abused: Not on file    Forced sexual activity: Not on file  Other Topics Concern  . Not on file  Social History Narrative  . Not on file    Outpatient Encounter Medications as of 10/01/2019  Medication Sig  . albuterol (PROAIR HFA) 108 (  90 Base) MCG/ACT inhaler Inhale 2 puffs into the lungs every 6 (six) hours as needed.  Marland Kitchen ibuprofen (ADVIL,MOTRIN) 600 MG tablet Take 600 mg by mouth at bedtime.  Marland Kitchen levothyroxine (SYNTHROID) 125 MCG tablet Take 1 tablet (125 mcg total) by mouth daily before breakfast.  . Nebivolol HCl (BYSTOLIC) 20 MG TABS Take 1 tablet (20 mg total) by mouth daily.  Marland Kitchen omeprazole (PRILOSEC) 20 MG capsule Take 1 capsule (20 mg total) by mouth daily.  . [DISCONTINUED] albuterol (PROAIR HFA) 108 (90 BASE) MCG/ACT inhaler Inhale 2 puffs into the lungs every 6 (six) hours as needed.  . [DISCONTINUED] ALPRAZolam (XANAX) 0.25 MG tablet Take 0.25 mg by mouth daily as needed for anxiety.  . [DISCONTINUED] levothyroxine (SYNTHROID) 125 MCG tablet Take 125 mcg by mouth daily  before breakfast.  . [DISCONTINUED] Nebivolol HCl (BYSTOLIC) 20 MG TABS Take by mouth.  . [DISCONTINUED] omeprazole (PRILOSEC) 20 MG capsule Take 20 mg by mouth daily.  . [DISCONTINUED] SYMBICORT 80-4.5 MCG/ACT inhaler USE 2 INHALATIONS TWICE A DAY  . busPIRone (BUSPAR) 5 MG tablet Take 1 tablet (5 mg total) by mouth 3 (three) times daily as needed (anxiety).  Marland Kitchen diclofenac sodium (VOLTAREN) 1 % GEL Apply 4 g topically 4 (four) times daily.  Marland Kitchen nystatin-triamcinolone ointment (MYCOLOG) Apply 1 application topically 2 (two) times daily.  . Tiotropium Bromide Monohydrate (SPIRIVA RESPIMAT) 1.25 MCG/ACT AERS Inhale 1 Inhaler into the lungs daily.   No facility-administered encounter medications on file as of 10/01/2019.     Allergies  Allergen Reactions  . Cephalexin Palpitations  . Levofloxacin Hives  . Amoxicillin-Pot Clavulanate Nausea And Vomiting  . Ketek [Telithromycin] Palpitations  . Statins Rash    Review of Systems  Constitutional: Negative for activity change, appetite change, chills, diaphoresis, fatigue, fever, malaise/fatigue, unexpected weight change, weight gain and weight loss.  HENT: Negative.  Negative for congestion, ear pain, hoarse voice, postnasal drip, rhinorrhea and sore throat.   Eyes: Negative.  Negative for blurred vision, photophobia, pain and visual disturbance.  Respiratory: Positive for cough, shortness of breath (exertional) and wheezing. Negative for hemoptysis, choking and chest tightness.   Cardiovascular: Negative for chest pain, palpitations, orthopnea, leg swelling and PND.  Gastrointestinal: Negative for abdominal distention, abdominal pain, anal bleeding, anorexia, blood in stool, constipation, diarrhea, dysphagia, heartburn, melena, nausea, rectal pain and vomiting.  Endocrine: Positive for cold intolerance. Negative for heat intolerance, polydipsia, polyphagia and polyuria.  Genitourinary: Negative for decreased urine volume, difficulty urinating,  dysuria, frequency and urgency.  Musculoskeletal: Positive for arthralgias (right knee) and joint pain. Negative for back pain, gait problem, joint swelling, myalgias, muscle weakness, neck pain and neck stiffness.  Skin: Positive for rash. Negative for color change, nail changes and pallor.  Allergic/Immunologic: Negative.   Neurological: Negative for tingling, tremors, focal weakness, seizures, syncope, facial asymmetry, speech difficulty, weakness, light-headedness, numbness and headaches.  Hematological: Negative.   Psychiatric/Behavioral: Negative for agitation, confusion, hallucinations, sleep disturbance and suicidal ideas. The patient is not nervous/anxious.   All other systems reviewed and are negative.       Objective:  BP 118/77   Pulse 72   Temp 98 F (36.7 C)   Resp 20   Ht 5' (1.524 m)   Wt 130 lb (59 kg)   SpO2 100%   BMI 25.39 kg/m    Wt Readings from Last 3 Encounters:  10/01/19 130 lb (59 kg)  12/26/18 127 lb 6.4 oz (57.8 kg)  09/25/18 129 lb 9.6 oz (58.8 kg)  Physical Exam Vitals signs and nursing note reviewed.  Constitutional:      General: She is not in acute distress.    Appearance: Normal appearance. She is well-developed and well-groomed. She is not ill-appearing, toxic-appearing or diaphoretic.  HENT:     Head: Normocephalic and atraumatic.     Jaw: There is normal jaw occlusion.     Right Ear: Hearing, tympanic membrane, ear canal and external ear normal.     Left Ear: Hearing, tympanic membrane, ear canal and external ear normal.     Nose: Nose normal.     Mouth/Throat:     Lips: Pink.     Mouth: Mucous membranes are moist.     Pharynx: Oropharynx is clear. Uvula midline.  Eyes:     General: Lids are normal.     Extraocular Movements: Extraocular movements intact.     Conjunctiva/sclera: Conjunctivae normal.     Pupils: Pupils are equal, round, and reactive to light.  Neck:     Musculoskeletal: Normal range of motion and neck supple.      Thyroid: No thyroid mass, thyromegaly or thyroid tenderness.     Vascular: No carotid bruit or JVD.     Trachea: Trachea and phonation normal.  Cardiovascular:     Rate and Rhythm: Normal rate and regular rhythm.     Chest Wall: PMI is not displaced.     Pulses: Normal pulses.     Heart sounds: Normal heart sounds. No murmur. No friction rub. No gallop.   Pulmonary:     Effort: Pulmonary effort is normal. No respiratory distress.     Breath sounds: Normal breath sounds. No wheezing.  Abdominal:     General: Bowel sounds are normal. There is no distension or abdominal bruit.     Palpations: Abdomen is soft. There is no hepatomegaly or splenomegaly.     Tenderness: There is no abdominal tenderness. There is no right CVA tenderness or left CVA tenderness.     Hernia: No hernia is present.  Musculoskeletal: Normal range of motion.     Right hip: Normal.     Right knee: She exhibits swelling. She exhibits normal range of motion, no effusion, no ecchymosis, no deformity, no laceration, no erythema, normal alignment, no LCL laxity, normal patellar mobility, no bony tenderness, normal meniscus and no MCL laxity. Tenderness found. Lateral joint line tenderness noted. No medial joint line, no MCL, no LCL and no patellar tendon tenderness noted.     Right ankle: Normal.     Right lower leg: No edema.     Left lower leg: No edema.  Lymphadenopathy:     Cervical: No cervical adenopathy.  Skin:    General: Skin is warm and dry.     Capillary Refill: Capillary refill takes less than 2 seconds.     Coloration: Skin is not cyanotic, jaundiced or pale.     Findings: Erythema, lesion and rash present. Rash is scaling.       Neurological:     General: No focal deficit present.     Mental Status: She is alert and oriented to person, place, and time.     Cranial Nerves: Cranial nerves are intact. No cranial nerve deficit.     Sensory: Sensation is intact. No sensory deficit.     Motor: Motor function  is intact. No weakness.     Coordination: Coordination is intact. Coordination normal.     Gait: Gait is intact. Gait normal.     Deep  Tendon Reflexes: Reflexes are normal and symmetric. Reflexes normal.  Psychiatric:        Attention and Perception: Attention and perception normal.        Mood and Affect: Mood and affect normal.        Speech: Speech normal.        Behavior: Behavior normal. Behavior is cooperative.        Thought Content: Thought content normal.        Cognition and Memory: Cognition and memory normal.        Judgment: Judgment normal.     Results for orders placed or performed during the hospital encounter of 02/13/19  I-STAT creatinine  Result Value Ref Range   Creatinine, Ser 0.80 0.44 - 1.00 mg/dL       Pertinent labs & imaging results that were available during my care of the patient were reviewed by me and considered in my medical decision making.  Assessment & Plan:  Candice Jackson was seen today for establish care.  Diagnoses and all orders for this visit:  GAD (generalized anxiety disorder) Has taken Xanax as needed in the past. Has not taken in several months. Denies daily controlled medication. Would only like something to help if symptoms become bad. Will trial below. Pt aware to report any new or worsening symptoms.  -     busPIRone (BUSPAR) 5 MG tablet; Take 1 tablet (5 mg total) by mouth 3 (three) times daily as needed (anxiety). -     Thyroid Panel With TSH  Mixed hyperlipidemia Has been diet controlled. Will recheck levels today.  -     Lipid panel  Mixed simple and mucopurulent chronic bronchitis ([place]) Did not tolerate Symbicort. Will trial Spiriva. Pt to report any new or worsening symptoms. Continue SABA as needed for break through symptoms.  -     albuterol (PROAIR HFA) 108 (90 Base) MCG/ACT inhaler; Inhale 2 puffs into the lungs every 6 (six) hours as needed. -     Tiotropium Bromide Monohydrate (SPIRIVA RESPIMAT) 1.25 MCG/ACT AERS; Inhale  1 Inhaler into the lungs daily.  Acquired hypothyroidism Last TSH elevated and dose was decreased. Has not had TSH evaluated since dose change. Will check today.  -     levothyroxine (SYNTHROID) 125 MCG tablet; Take 1 tablet (125 mcg total) by mouth daily before breakfast. -     Thyroid Panel With TSH  Essential (primary) hypertension Well controlled with current regimen. Will continue. Labs pending.  -     Nebivolol HCl (BYSTOLIC) 20 MG TABS; Take 1 tablet (20 mg total) by mouth daily. -     CMP14+EGFR -     CBC with Differential/Platelet -     Lipid panel -     Thyroid Panel With TSH  Gastroesophageal reflux disease without esophagitis -     omeprazole (PRILOSEC) 20 MG capsule; Take 1 capsule (20 mg total) by mouth daily. -     CBC with Differential/Platelet  Acute pain of right knee Ongoing for 2 weeks. No known injury. Will trial diclofenac gel. Symptomatic care discussed. Report any new or worsening symptoms.  -     diclofenac sodium (VOLTAREN) 1 % GEL; Apply 4 g topically 4 (four) times daily.  Rash of hand Red, raised rash with central keratotic lesion. Will trial below. If not beneficial, will refer to dermatology.  -     nystatin-triamcinolone ointment (MYCOLOG); Apply 1 application topically 2 (two) times daily.     Continue all other maintenance  medications.  Follow up plan: Return if symptoms worsen or fail to improve, for HTN, Thyroid.  Continue healthy lifestyle choices, including diet (rich in fruits, vegetables, and lean proteins, and low in salt and simple carbohydrates) and exercise (at least 30 minutes of moderate physical activity daily).  Educational handout given for health maintenance  The above assessment and management plan was discussed with the patient. The patient verbalized understanding of and has agreed to the management plan. Patient is aware to call the clinic if they develop any new symptoms or if symptoms persist or worsen. Patient is aware  when to return to the clinic for a follow-up visit. Patient educated on when it is appropriate to go to the emergency department.   Monia Pouch, FNP-C [facility] [phone]

## 2019-10-01 NOTE — Patient Instructions (Signed)
Health Maintenance, Female Adopting a healthy lifestyle and getting preventive care are important in promoting health and wellness. Ask your health care provider about:  The right schedule for you to have regular tests and exams.  Things you can do on your own to prevent diseases and keep yourself healthy. What should I know about diet, weight, and exercise? Eat a healthy diet   Eat a diet that includes plenty of vegetables, fruits, low-fat dairy products, and lean protein.  Do not eat a lot of foods that are high in solid fats, added sugars, or sodium. Maintain a healthy weight Body mass index (BMI) is used to identify weight problems. It estimates body fat based on height and weight. Your health care provider can help determine your BMI and help you achieve or maintain a healthy weight. Get regular exercise Get regular exercise. This is one of the most important things you can do for your health. Most adults should:  Exercise for at least 150 minutes each week. The exercise should increase your heart rate and make you sweat (moderate-intensity exercise).  Do strengthening exercises at least twice a week. This is in addition to the moderate-intensity exercise.  Spend less time sitting. Even light physical activity can be beneficial. Watch cholesterol and blood lipids Have your blood tested for lipids and cholesterol at 55 years of age, then have this test every 5 years. Have your cholesterol levels checked more often if:  Your lipid or cholesterol levels are high.  You are older than 55 years of age.  You are at high risk for heart disease. What should I know about cancer screening? Depending on your health history and family history, you may need to have cancer screening at various ages. This may include screening for:  Breast cancer.  Cervical cancer.  Colorectal cancer.  Skin cancer.  Lung cancer. What should I know about heart disease, diabetes, and high blood  pressure? Blood pressure and heart disease  High blood pressure causes heart disease and increases the risk of stroke. This is more likely to develop in people who have high blood pressure readings, are of African descent, or are overweight.  Have your blood pressure checked: ? Every 3-5 years if you are 18-39 years of age. ? Every year if you are 40 years old or older. Diabetes Have regular diabetes screenings. This checks your fasting blood sugar level. Have the screening done:  Once every three years after age 40 if you are at a normal weight and have a low risk for diabetes.  More often and at a younger age if you are overweight or have a high risk for diabetes. What should I know about preventing infection? Hepatitis B If you have a higher risk for hepatitis B, you should be screened for this virus. Talk with your health care provider to find out if you are at risk for hepatitis B infection. Hepatitis C Testing is recommended for:  Everyone born from 1945 through 1965.  Anyone with known risk factors for hepatitis C. Sexually transmitted infections (STIs)  Get screened for STIs, including gonorrhea and chlamydia, if: ? You are sexually active and are younger than 55 years of age. ? You are older than 55 years of age and your health care provider tells you that you are at risk for this type of infection. ? Your sexual activity has changed since you were last screened, and you are at increased risk for chlamydia or gonorrhea. Ask your health care provider if   you are at risk.  Ask your health care provider about whether you are at high risk for HIV. Your health care provider may recommend a prescription medicine to help prevent HIV infection. If you choose to take medicine to prevent HIV, you should first get tested for HIV. You should then be tested every 3 months for as long as you are taking the medicine. Pregnancy  If you are about to stop having your period (premenopausal) and  you may become pregnant, seek counseling before you get pregnant.  Take 400 to 800 micrograms (mcg) of folic acid every day if you become pregnant.  Ask for birth control (contraception) if you want to prevent pregnancy. Osteoporosis and menopause Osteoporosis is a disease in which the bones lose minerals and strength with aging. This can result in bone fractures. If you are 65 years old or older, or if you are at risk for osteoporosis and fractures, ask your health care provider if you should:  Be screened for bone loss.  Take a calcium or vitamin D supplement to lower your risk of fractures.  Be given hormone replacement therapy (HRT) to treat symptoms of menopause. Follow these instructions at home: Lifestyle  Do not use any products that contain nicotine or tobacco, such as cigarettes, e-cigarettes, and chewing tobacco. If you need help quitting, ask your health care provider.  Do not use street drugs.  Do not share needles.  Ask your health care provider for help if you need support or information about quitting drugs. Alcohol use  Do not drink alcohol if: ? Your health care provider tells you not to drink. ? You are pregnant, may be pregnant, or are planning to become pregnant.  If you drink alcohol: ? Limit how much you use to 0-1 drink a day. ? Limit intake if you are breastfeeding.  Be aware of how much alcohol is in your drink. In the [place] one drink equals one 12 oz bottle of beer (355 mL), one 5 oz glass of wine (148 mL), or one 1 oz glass of hard liquor (44 mL). General instructions  Schedule regular health, dental, and eye exams.  Stay current with your vaccines.  Tell your health care provider if: ? You often feel depressed. ? You have ever been abused or do not feel safe at home. Summary  Adopting a healthy lifestyle and getting preventive care are important in promoting health and wellness.  Follow your health care provider's instructions about healthy  diet, exercising, and getting tested or screened for diseases.  Follow your health care provider's instructions on monitoring your cholesterol and blood pressure. This information is not intended to replace advice given to you by your health care provider. Make sure you discuss any questions you have with your health care provider. Document Released: 05/31/2011 Document Revised: 11/08/2018 Document Reviewed: 11/08/2018 Elsevier Patient Education  2020 [employer]  

## 2019-10-02 LAB — CBC WITH DIFFERENTIAL/PLATELET
Basophils Absolute: 0.1 10*3/uL (ref 0.0–0.2)
Basos: 1 %
EOS (ABSOLUTE): 0.2 10*3/uL (ref 0.0–0.4)
Eos: 2 %
Hematocrit: 40.6 % (ref 34.0–46.6)
Hemoglobin: 14.1 g/dL (ref 11.1–15.9)
Immature Grans (Abs): 0 10*3/uL (ref 0.0–0.1)
Immature Granulocytes: 0 %
Lymphocytes Absolute: 3.5 10*3/uL — ABNORMAL HIGH (ref 0.7–3.1)
Lymphs: 38 %
MCH: 31.5 pg (ref 26.6–33.0)
MCHC: 34.7 g/dL (ref 31.5–35.7)
MCV: 91 fL (ref 79–97)
Monocytes Absolute: 0.7 10*3/uL (ref 0.1–0.9)
Monocytes: 8 %
Neutrophils Absolute: 4.8 10*3/uL (ref 1.4–7.0)
Neutrophils: 51 %
Platelets: 273 10*3/uL (ref 150–450)
RBC: 4.47 x10E6/uL (ref 3.77–5.28)
RDW: 12.2 % (ref 11.7–15.4)
WBC: 9.3 10*3/uL (ref 3.4–10.8)

## 2019-10-02 LAB — CMP14+EGFR
ALT: 23 IU/L (ref 0–32)
AST: 23 IU/L (ref 0–40)
Albumin/Globulin Ratio: 1.7 (ref 1.2–2.2)
Albumin: 4.7 g/dL (ref 3.8–4.9)
Alkaline Phosphatase: 91 IU/L (ref 39–117)
BUN/Creatinine Ratio: 11 (ref 9–23)
BUN: 7 mg/dL (ref 6–24)
Bilirubin Total: 0.3 mg/dL (ref 0.0–1.2)
CO2: 23 mmol/L (ref 20–29)
Calcium: 9.6 mg/dL (ref 8.7–10.2)
Chloride: 101 mmol/L (ref 96–106)
Creatinine, Ser: 0.65 mg/dL (ref 0.57–1.00)
GFR calc Af Amer: 116 mL/min/{1.73_m2} (ref >59)
GFR calc non Af Amer: 100 mL/min/{1.73_m2} (ref >59)
Globulin, Total: 2.7 g/dL (ref 1.5–4.5)
Glucose: 81 mg/dL (ref 65–99)
Potassium: 4.7 mmol/L (ref 3.5–5.2)
Sodium: 140 mmol/L (ref 134–144)
Total Protein: 7.4 g/dL (ref 6.0–8.5)

## 2019-10-02 LAB — LIPID PANEL
Chol/HDL Ratio: 5 ratio — ABNORMAL HIGH (ref 0.0–4.4)
Cholesterol, Total: 255 mg/dL — ABNORMAL HIGH (ref 100–199)
HDL: 51 mg/dL (ref >39)
LDL Chol Calc (NIH): 170 mg/dL — ABNORMAL HIGH (ref 0–99)
Triglycerides: 186 mg/dL — ABNORMAL HIGH (ref 0–149)
VLDL Cholesterol Cal: 34 mg/dL (ref 5–40)

## 2019-10-02 LAB — THYROID PANEL WITH TSH
Free Thyroxine Index: 3.8 (ref 1.2–4.9)
T3 Uptake Ratio: 34 % (ref 24–39)
T4, Total: 11.2 ug/dL (ref 4.5–12.0)
TSH: 0.013 u[IU]/mL — ABNORMAL LOW (ref 0.450–4.500)

## 2019-10-02 MED ORDER — LEVOTHYROXINE SODIUM 112 MCG PO TABS: 112.0000 ug | ORAL_TABLET | Freq: Every day | ORAL | 3 refills | Status: DC

## 2019-10-02 NOTE — Addendum Note (Signed)
Addended by: Baruch Gouty on: 10/02/2019 02:42 PM   Modules accepted: Orders

## 2019-10-15 ENCOUNTER — Ambulatory Visit (INDEPENDENT_AMBULATORY_CARE_PROVIDER_SITE_OTHER): Payer: 59 | Admitting: Family Medicine

## 2019-10-15 ENCOUNTER — Other Ambulatory Visit: Payer: Self-pay

## 2019-10-15 ENCOUNTER — Encounter: Payer: Self-pay | Admitting: Family Medicine

## 2019-10-15 DIAGNOSIS — J0110 Acute frontal sinusitis, unspecified: Secondary | ICD-10-CM | POA: Diagnosis not present

## 2019-10-15 DIAGNOSIS — J069 Acute upper respiratory infection, unspecified: Secondary | ICD-10-CM

## 2019-10-15 MED ORDER — DOXYCYCLINE HYCLATE 100 MG PO TABS: 100.0000 mg | ORAL_TABLET | Freq: Two times a day (BID) | ORAL | 0 refills | Status: AC

## 2019-10-15 MED ORDER — BENZONATATE 100 MG PO CAPS: 100.0000 mg | ORAL_CAPSULE | Freq: Three times a day (TID) | ORAL | 0 refills | Status: DC | PRN

## 2019-10-15 MED ORDER — FLUTICASONE PROPIONATE 50 MCG/ACT NA SUSP
2.0000 | Freq: Every day | NASAL | 6 refills | Status: DC
Start: 2019-10-15 — End: 2020-08-26

## 2019-10-15 NOTE — Progress Notes (Signed)
Virtual Visit via telephone Note Due to COVID-19 pandemic this visit was conducted virtually. This visit type was conducted due to national recommendations for restrictions regarding the COVID-19 Pandemic (e.g. social distancing, sheltering in place) in an effort to limit this patient's exposure and mitigate transmission in our community. All issues noted in this document were discussed and addressed.  A physical exam was not performed with this format.   I connected with Candice Jackson on 10/15/2019 at 1100 by telephone and verified that I am speaking with the correct person using two identifiers. Candice Jackson is currently located at home and family is currently with them during visit. The provider, Kari Baars, FNP is located in their office at time of visit.  I discussed the limitations, risks, security and privacy concerns of performing an evaluation and management service by telephone and the availability of in person appointments. I also discussed with the patient that there may be a patient responsible charge related to this service. The patient expressed understanding and agreed to proceed.  Subjective:  Patient ID: Candice Jackson, female    DOB: 11/01/1966, 53 y.o.   MRN: [id number]  Chief Complaint:  URI   HPI: Candice Jackson is a 53 y.o. female presenting on 10/15/2019 for URI   Pt reports ongoing cough, congestion, frontal sinus pressure, postnasal drip, rhinorrhea, and pressure in ears. Pt states this started 2 weeks ago and is getting worse. States she has been using tussin DM over the counter without relief of the symptoms. States the pain is aching to pressure, 6/10. Worse with bending over and at night. States the she is coughing up thick greenish-brown colored sputum. She has had chills. Has not measured her temperature. She denies exposure to anyone sick. No chest pain, shortness of breath, weight loss, night sweats, rash, or abdominal pain.     Relevant  past medical, surgical, family, and social history reviewed and updated as indicated.  Allergies and medications reviewed and updated.   Past Medical History:  Diagnosis Date  . Allergic rhinitis   . Allergy   . Chronic bronchitis (HCC)   . Constipation   . GERD (gastroesophageal reflux disease)   . Hemorrhoid   . HTN (hypertension)   . Hyperlipidemia   . Sinusitis   . Thyroid disease     Past Surgical History:  Procedure Laterality Date  . HEMORROIDECTOMY    . R ankle surgery    . TOTAL ABDOMINAL HYSTERECTOMY      Social History   Socioeconomic History  . Marital status: Married    Spouse name: Loraine Leriche  . Number of children: 2  . Years of education: Not on file  . Highest education level: Not on file  Occupational History  . Occupation: Risk manager: [employer]  Social Needs  . Financial resource strain: Not on file  . Food insecurity    Worry: Not on file    Inability: Not on file  . Transportation needs    Medical: Not on file    Non-medical: Not on file  Tobacco Use  . Smoking status: Current Every Day Smoker    Packs/day: 0.80    Years: 20.00    Pack years: 16.00    Types: Cigarettes  . Smokeless tobacco: Never Used  Substance and Sexual Activity  . Alcohol use: Yes    Comment: 3-4 beers a night  . Drug use: Never  . Sexual activity: Not on file  Lifestyle  . Physical activity    Days per week: Not on file    Minutes per session: Not on file  . Stress: Not on file  Relationships  . Social Musicianconnections    Talks on phone: Not on file    Gets together: Not on file    Attends religious service: Not on file    Active member of club or organization: Not on file    Attends meetings of clubs or organizations: Not on file    Relationship status: Not on file  . Intimate partner violence    Fear of current or ex partner: Not on file    Emotionally abused: Not on file    Physically abused: Not on file    Forced sexual activity: Not  on file  Other Topics Concern  . Not on file  Social History Narrative  . Not on file    Outpatient Encounter Medications as of 10/15/2019  Medication Sig  . albuterol (PROAIR HFA) 108 (90 Base) MCG/ACT inhaler Inhale 2 puffs into the lungs every 6 (six) hours as needed.  . benzonatate (TESSALON PERLES) 100 MG capsule Take 1 capsule (100 mg total) by mouth 3 (three) times daily as needed for cough.  . busPIRone (BUSPAR) 5 MG tablet Take 1 tablet (5 mg total) by mouth 3 (three) times daily as needed (anxiety).  Marland Kitchen. diclofenac sodium (VOLTAREN) 1 % GEL Apply 4 g topically 4 (four) times daily.  Marland Kitchen. doxycycline (VIBRA-TABS) 100 MG tablet Take 1 tablet (100 mg total) by mouth 2 (two) times daily for 10 days. 1 po bid  . fluticasone (FLONASE) 50 MCG/ACT nasal spray Place 2 sprays into both nostrils daily.  Marland Kitchen. ibuprofen (ADVIL,MOTRIN) 600 MG tablet Take 600 mg by mouth at bedtime.  Marland Kitchen. levothyroxine (SYNTHROID) 112 MCG tablet Take 1 tablet (112 mcg total) by mouth daily.  . Nebivolol HCl (BYSTOLIC) 20 MG TABS Take 1 tablet (20 mg total) by mouth daily.  Marland Kitchen. nystatin-triamcinolone ointment (MYCOLOG) Apply 1 application topically 2 (two) times daily.  Marland Kitchen. omeprazole (PRILOSEC) 20 MG capsule Take 1 capsule (20 mg total) by mouth daily.  . Tiotropium Bromide Monohydrate (SPIRIVA RESPIMAT) 1.25 MCG/ACT AERS Inhale 1 Inhaler into the lungs daily.   No facility-administered encounter medications on file as of 10/15/2019.     Allergies  Allergen Reactions  . Cephalexin Palpitations  . Levofloxacin Hives  . Amoxicillin-Pot Clavulanate Nausea And Vomiting  . Ketek [Telithromycin] Palpitations  . Statins Rash    Review of Systems  Constitutional: Positive for chills and fatigue. Negative for activity change, appetite change, diaphoresis, fever and unexpected weight change.  HENT: Positive for congestion, ear pain, postnasal drip, rhinorrhea, sinus pressure, sinus pain and sore throat. Negative for sneezing,  tinnitus, trouble swallowing and voice change.   Eyes: Negative.  Negative for photophobia and visual disturbance.  Respiratory: Positive for cough. Negative for chest tightness, shortness of breath and wheezing.   Cardiovascular: Negative for chest pain, palpitations and leg swelling.  Gastrointestinal: Negative for abdominal pain, blood in stool, constipation, diarrhea, nausea and vomiting.  Endocrine: Negative.   Genitourinary: Negative for decreased urine volume, difficulty urinating, dysuria, frequency and urgency.  Musculoskeletal: Negative for arthralgias and myalgias.  Skin: Negative.  Negative for color change, pallor and rash.  Allergic/Immunologic: Negative.   Neurological: Positive for headaches. Negative for dizziness, tremors, seizures, syncope, facial asymmetry, speech difficulty, weakness, light-headedness and numbness.  Hematological: Negative.   Psychiatric/Behavioral: Negative for confusion, hallucinations, sleep disturbance and suicidal  ideas.  All other systems reviewed and are negative.        Observations/Objective: No vital signs or physical exam, this was a telephone or virtual health encounter.  Pt alert and oriented, answers all questions appropriately, and able to speak in full sentences.    Assessment and Plan: Demarie was seen today for uri.  Diagnoses and all orders for this visit:  URI with cough and congestion Acute non-recurrent frontal sinusitis Symptoms consistent with acute frontal sinusitis. Has failed symptomatic care over the last 2 weeks. Due to ongoing symptoms despite symptomatic care, will initiate below. Continue symptomatic care. Report any new, worsening, or persistent symptoms.  -     doxycycline (VIBRA-TABS) 100 MG tablet; Take 1 tablet (100 mg total) by mouth 2 (two) times daily for 10 days. 1 po bid -     fluticasone (FLONASE) 50 MCG/ACT nasal spray; Place 2 sprays into both nostrils daily.      -     benzonatate (TESSALON PERLES)  100 MG capsule; Take 1 capsule (100           mg total) by mouth 3 (three) times daily as needed for cough    Follow Up Instructions: Return if symptoms worsen or fail to improve.    I discussed the assessment and treatment plan with the patient. The patient was provided an opportunity to ask questions and all were answered. The patient agreed with the plan and demonstrated an understanding of the instructions.   The patient was advised to call back or seek an in-person evaluation if the symptoms worsen or if the condition fails to improve as anticipated.  The above assessment and management plan was discussed with the patient. The patient verbalized understanding of and has agreed to the management plan. Patient is aware to call the clinic if they develop any new symptoms or if symptoms persist or worsen. Patient is aware when to return to the clinic for a follow-up visit. Patient educated on when it is appropriate to go to the emergency department.    I provided 15 minutes of non-face-to-face time during this encounter. The call started at 1100. The call ended at 1115. The other time was used for coordination of care.    Monia Pouch, FNP-C [facility] [address] [place], [place] [address] [phone] 10/15/2019

## 2019-10-19 ENCOUNTER — Ambulatory Visit (INDEPENDENT_AMBULATORY_CARE_PROVIDER_SITE_OTHER): Payer: 59 | Admitting: Family Medicine

## 2019-10-19 ENCOUNTER — Encounter: Payer: Self-pay | Admitting: Family Medicine

## 2019-10-19 ENCOUNTER — Other Ambulatory Visit: Payer: Self-pay

## 2019-10-19 DIAGNOSIS — J441 Chronic obstructive pulmonary disease with (acute) exacerbation: Secondary | ICD-10-CM

## 2019-10-19 NOTE — Progress Notes (Signed)
Virtual Visit via telephone Note Due to COVID-19 pandemic this visit was conducted virtually. This visit type was conducted due to national recommendations for restrictions regarding the COVID-19 Pandemic (e.g. social distancing, sheltering in place) in an effort to limit this patient's exposure and mitigate transmission in our community. All issues noted in this document were discussed and addressed.  A physical exam was not performed with this format.   I connected with Candice Jackson on 10/19/2019 at 1435 by telephone and verified that I am speaking with the correct person using two identifiers. [name] is currently located at home and family is currently with them during visit. The provider, Kari Baars, FNP is located in their office at time of visit.  I discussed the limitations, risks, security and privacy concerns of performing an evaluation and management service by telephone and the availability of in person appointments. I also discussed with the patient that there may be a patient responsible charge related to this service. The patient expressed understanding and agreed to proceed.  Subjective:  Patient ID: Candice Jackson, female    DOB: 07-11-1966, 53 y.o.   MRN: [id number]  Chief Complaint:  COPD   HPI: Candice Jackson is a 53 y.o. female presenting on 10/19/2019 for COPD   Pt reports worsening cough, congestion, drainage, sputum production, and exertional shortness of breath despite 4 days of symptomatic and antibiotic therapy. Pt states her cough is much worse causing increased shortness of breath.   COPD She complains of chest tightness, cough, difficulty breathing, hoarse voice, shortness of breath, sputum production and wheezing. There is no frequent throat clearing or hemoptysis. This is a recurrent problem. The current episode started 1 to 4 weeks ago. The problem occurs constantly. The problem has been gradually worsening. The cough is productive of  blood-tinged sputum, productive and harsh. Associated symptoms include dyspnea on exertion, malaise/fatigue, nasal congestion, orthopnea and postnasal drip. Pertinent negatives include no appetite change, chest pain, ear congestion, ear pain, fever, headaches, heartburn, myalgias, PND, rhinorrhea, sneezing, sore throat, sweats, trouble swallowing or weight loss. Her symptoms are aggravated by any activity. Her symptoms are not alleviated by beta-agonist, OTC cough suppressant, cold air, change in position and prescription cough suppressant. Her past medical history is significant for asthma and COPD.     Relevant past medical, surgical, family, and social history reviewed and updated as indicated.  Allergies and medications reviewed and updated.   Past Medical History:  Diagnosis Date  . Allergic rhinitis   . Allergy   . Chronic bronchitis (HCC)   . Constipation   . GERD (gastroesophageal reflux disease)   . Hemorrhoid   . HTN (hypertension)   . Hyperlipidemia   . Sinusitis   . Thyroid disease     Past Surgical History:  Procedure Laterality Date  . HEMORROIDECTOMY    . R ankle surgery    . TOTAL ABDOMINAL HYSTERECTOMY      Social History   Socioeconomic History  . Marital status: Married    Spouse name: Loraine Leriche  . Number of children: 2  . Years of education: Not on file  . Highest education level: Not on file  Occupational History  . Occupation: Risk manager: [employer]  Social Needs  . Financial resource strain: Not on file  . Food insecurity    Worry: Not on file    Inability: Not on file  . Transportation needs    Medical: Not on file  Non-medical: Not on file  Tobacco Use  . Smoking status: Current Every Day Smoker    Packs/day: 0.80    Years: 20.00    Pack years: 16.00    Types: Cigarettes  . Smokeless tobacco: Never Used  Substance and Sexual Activity  . Alcohol use: Yes    Comment: 3-4 beers a night  . Drug use: Never  . Sexual  activity: Not on file  Lifestyle  . Physical activity    Days per week: Not on file    Minutes per session: Not on file  . Stress: Not on file  Relationships  . Social Musicianconnections    Talks on phone: Not on file    Gets together: Not on file    Attends religious service: Not on file    Active member of club or organization: Not on file    Attends meetings of clubs or organizations: Not on file    Relationship status: Not on file  . Intimate partner violence    Fear of current or ex partner: Not on file    Emotionally abused: Not on file    Physically abused: Not on file    Forced sexual activity: Not on file  Other Topics Concern  . Not on file  Social History Narrative  . Not on file    Outpatient Encounter Medications as of 10/19/2019  Medication Sig  . albuterol (PROAIR HFA) 108 (90 Base) MCG/ACT inhaler Inhale 2 puffs into the lungs every 6 (six) hours as needed.  . benzonatate (TESSALON PERLES) 100 MG capsule Take 1 capsule (100 mg total) by mouth 3 (three) times daily as needed for cough.  . busPIRone (BUSPAR) 5 MG tablet Take 1 tablet (5 mg total) by mouth 3 (three) times daily as needed (anxiety).  Marland Kitchen. diclofenac sodium (VOLTAREN) 1 % GEL Apply 4 g topically 4 (four) times daily.  Marland Kitchen. doxycycline (VIBRA-TABS) 100 MG tablet Take 1 tablet (100 mg total) by mouth 2 (two) times daily for 10 days. 1 po bid  . fluticasone (FLONASE) 50 MCG/ACT nasal spray Place 2 sprays into both nostrils daily.  Marland Kitchen. ibuprofen (ADVIL,MOTRIN) 600 MG tablet Take 600 mg by mouth at bedtime.  Marland Kitchen. levothyroxine (SYNTHROID) 112 MCG tablet Take 1 tablet (112 mcg total) by mouth daily.  . Nebivolol HCl (BYSTOLIC) 20 MG TABS Take 1 tablet (20 mg total) by mouth daily.  Marland Kitchen. nystatin-triamcinolone ointment (MYCOLOG) Apply 1 application topically 2 (two) times daily.  Marland Kitchen. omeprazole (PRILOSEC) 20 MG capsule Take 1 capsule (20 mg total) by mouth daily.  . Tiotropium Bromide Monohydrate (SPIRIVA RESPIMAT) 1.25 MCG/ACT  AERS Inhale 1 Inhaler into the lungs daily.   No facility-administered encounter medications on file as of 10/19/2019.     Allergies  Allergen Reactions  . Cephalexin Palpitations  . Levofloxacin Hives  . Amoxicillin-Pot Clavulanate Nausea And Vomiting  . Ketek [Telithromycin] Palpitations  . Statins Rash    Review of Systems  Constitutional: Positive for activity change, fatigue and malaise/fatigue. Negative for appetite change, chills, diaphoresis, fever, unexpected weight change and weight loss.  HENT: Positive for congestion, hoarse voice and postnasal drip. Negative for ear pain, rhinorrhea, sneezing, sore throat and trouble swallowing.   Eyes: Negative.   Respiratory: Positive for cough, sputum production, shortness of breath and wheezing. Negative for hemoptysis and chest tightness.   Cardiovascular: Positive for dyspnea on exertion. Negative for chest pain, palpitations, leg swelling and PND.  Gastrointestinal: Negative for abdominal pain, blood in stool, constipation, diarrhea,  heartburn, nausea and vomiting.  Endocrine: Negative.   Genitourinary: Negative for decreased urine volume, difficulty urinating, dysuria, frequency and urgency.  Musculoskeletal: Negative for arthralgias and myalgias.  Skin: Negative.  Negative for color change, pallor and rash.  Allergic/Immunologic: Negative.   Neurological: Negative for dizziness, weakness and headaches.  Hematological: Negative.   Psychiatric/Behavioral: Negative for confusion, hallucinations, sleep disturbance and suicidal ideas.  All other systems reviewed and are negative.        Observations/Objective: No vital signs or physical exam, this was a telephone or virtual health encounter.  Pt alert and oriented, answers all questions appropriately, and able to speak in full sentences.    Assessment and Plan: Kasiya was seen today for copd.  Diagnoses and all orders for this visit:  COPD exacerbation ([place]) Worsening  symptoms despite antibiotic and symptomatic care. Pt advised to go to ED for evaluation and treatment due to failed outpatient management. Pt verbalized understanding and agrees to go to ED.     Follow Up Instructions: Return in about 2 weeks (around 11/02/2019), or if symptoms worsen or fail to improve, for COPD.    I discussed the assessment and treatment plan with the patient. The patient was provided an opportunity to ask questions and all were answered. The patient agreed with the plan and demonstrated an understanding of the instructions.   The patient was advised to call back or seek an in-person evaluation if the symptoms worsen or if the condition fails to improve as anticipated.  The above assessment and management plan was discussed with the patient. The patient verbalized understanding of and has agreed to the management plan. Patient is aware to call the clinic if they develop any new symptoms or if symptoms persist or worsen. Patient is aware when to return to the clinic for a follow-up visit. Patient educated on when it is appropriate to go to the emergency department.    I provided 10 minutes of non-face-to-face time during this encounter. The call started at 1435. The call ended at 1445. The other time was used for coordination of care.    Monia Pouch, FNP-C [facility] [address] [place], [place] [address] [phone] 10/19/2019

## 2019-11-01 ENCOUNTER — Other Ambulatory Visit: Payer: Self-pay | Admitting: Family Medicine

## 2019-11-01 DIAGNOSIS — R21 Rash and other nonspecific skin eruption: Secondary | ICD-10-CM

## 2019-11-14 ENCOUNTER — Telehealth: Payer: Self-pay | Admitting: Family Medicine

## 2019-11-14 DIAGNOSIS — E039 Hypothyroidism, unspecified: Secondary | ICD-10-CM

## 2019-11-14 NOTE — Telephone Encounter (Signed)
What is the name of the medication? levothyroxine (SYNTHROID) 112 MCG tablet    Have you contacted your pharmacy to request a refill? YES  Which pharmacy would you like this sent to? [facility] told pt they can not refill it until they get a call from PCP about MG being changed    Patient notified that their request is being sent to the clinical staff for review and that they should receive a call once it is complete. If they do not receive a call within 24 hours they can check with their pharmacy or our office.

## 2019-11-16 MED ORDER — LEVOTHYROXINE SODIUM 112 MCG PO TABS: 112.0000 ug | ORAL_TABLET | Freq: Every day | ORAL | 3 refills | Status: DC

## 2019-11-16 NOTE — Telephone Encounter (Signed)
Refill sent.

## 2019-11-26 ENCOUNTER — Other Ambulatory Visit: Payer: Self-pay

## 2019-11-26 ENCOUNTER — Ambulatory Visit (INDEPENDENT_AMBULATORY_CARE_PROVIDER_SITE_OTHER): Payer: 59 | Admitting: Family Medicine

## 2019-11-26 ENCOUNTER — Ambulatory Visit (INDEPENDENT_AMBULATORY_CARE_PROVIDER_SITE_OTHER): Payer: 59

## 2019-11-26 ENCOUNTER — Encounter: Payer: Self-pay | Admitting: Family Medicine

## 2019-11-26 VITALS — BP 123/88 | HR 75 | Temp 98.9°F | Resp 20 | Ht 60.0 in | Wt 131.0 lb

## 2019-11-26 DIAGNOSIS — M25561 Pain in right knee: Secondary | ICD-10-CM

## 2019-11-26 DIAGNOSIS — Z0001 Encounter for general adult medical examination with abnormal findings: Secondary | ICD-10-CM | POA: Diagnosis not present

## 2019-11-26 DIAGNOSIS — I10 Essential (primary) hypertension: Secondary | ICD-10-CM

## 2019-11-26 DIAGNOSIS — G8929 Other chronic pain: Secondary | ICD-10-CM | POA: Insufficient documentation

## 2019-11-26 DIAGNOSIS — K219 Gastro-esophageal reflux disease without esophagitis: Secondary | ICD-10-CM

## 2019-11-26 DIAGNOSIS — Z1231 Encounter for screening mammogram for malignant neoplasm of breast: Secondary | ICD-10-CM

## 2019-11-26 MED ORDER — OMEPRAZOLE 20 MG PO CPDR: 20.0000 mg | DELAYED_RELEASE_CAPSULE | Freq: Every day | ORAL | 2 refills | Status: DC

## 2019-11-26 MED ORDER — PREDNISONE 20 MG PO TABS: ORAL_TABLET | ORAL | 0 refills | Status: DC

## 2019-11-26 MED ORDER — BYSTOLIC 20 MG PO TABS: 20.0000 mg | ORAL_TABLET | Freq: Every day | ORAL | 2 refills | Status: DC

## 2019-11-26 NOTE — Patient Instructions (Addendum)
DASH Eating Plan DASH stands for "Dietary Approaches to Stop Hypertension." The DASH eating plan is a healthy eating plan that has been shown to reduce high blood pressure (hypertension). Additional health benefits may include reducing the risk of type 2 diabetes mellitus, heart disease, and stroke. The DASH eating plan may also help with weight loss.  WHAT DO I NEED TO KNOW ABOUT THE DASH EATING PLAN? For the DASH eating plan, you will follow these general guidelines:  Choose foods with a percent daily value for sodium of less than 5% (as listed on the food label).  Use salt-free seasonings or herbs instead of table salt or sea salt.  Check with your health care provider or pharmacist before using salt substitutes.  Eat lower-sodium products, often labeled as "lower sodium" or "no salt added."  Eat fresh foods.  Eat more vegetables, fruits, and low-fat dairy products.  Choose whole grains. Look for the word "whole" as the first word in the ingredient list.  Choose fish and skinless chicken or turkey more often than red meat. Limit fish, poultry, and meat to 6 oz (170 g) each day.  Limit sweets, desserts, sugars, and sugary drinks.  Choose heart-healthy fats.  Limit cheese to 1 oz (28 g) per day.  Eat more home-cooked food and less restaurant, buffet, and fast food.  Limit fried foods.  Cook foods using methods other than frying.  Limit canned vegetables. If you do use them, rinse them well to decrease the sodium.  When eating at a restaurant, ask that your food be prepared with less salt, or no salt if possible.  WHAT FOODS CAN I EAT? Seek help from a dietitian for individual calorie needs.  Grains Whole grain or whole wheat bread. Brown rice. Whole grain or whole wheat pasta. Quinoa, bulgur, and whole grain cereals. Low-sodium cereals. Corn or whole wheat flour tortillas. Whole grain cornbread. Whole grain crackers. Low-sodium crackers.  Vegetables Fresh or frozen  vegetables (raw, steamed, roasted, or grilled). Low-sodium or reduced-sodium tomato and vegetable juices. Low-sodium or reduced-sodium tomato sauce and paste. Low-sodium or reduced-sodium canned vegetables.   Fruits All fresh, canned (in natural juice), or frozen fruits.  Meat and Other Protein Products Ground beef (85% or leaner), grass-fed beef, or beef trimmed of fat. Skinless chicken or turkey. Ground chicken or turkey. Pork trimmed of fat. All fish and seafood. Eggs. Dried beans, peas, or lentils. Unsalted nuts and seeds. Unsalted canned beans.  Dairy Low-fat dairy products, such as skim or 1% milk, 2% or reduced-fat cheeses, low-fat ricotta or cottage cheese, or plain low-fat yogurt. Low-sodium or reduced-sodium cheeses.  Fats and Oils Tub margarines without trans fats. Light or reduced-fat mayonnaise and salad dressings (reduced sodium). Avocado. Safflower, olive, or canola oils. Natural peanut or almond butter.  Other Unsalted popcorn and pretzels. The items listed above may not be a complete list of recommended foods or beverages. Contact your dietitian for more options.  WHAT FOODS ARE NOT RECOMMENDED?  Grains White bread. White pasta. White rice. Refined cornbread. Bagels and croissants. Crackers that contain trans fat.  Vegetables Creamed or fried vegetables. Vegetables in a cheese sauce. Regular canned vegetables. Regular canned tomato sauce and paste. Regular tomato and vegetable juices.  Fruits Dried fruits. Canned fruit in light or heavy syrup. Fruit juice.  Meat and Other Protein Products Fatty cuts of meat. Ribs, chicken wings, bacon, sausage, bologna, salami, chitterlings, fatback, hot dogs, bratwurst, and packaged luncheon meats. Salted nuts and seeds. Canned beans with salt.    Dairy Whole or 2% milk, cream, half-and-half, and cream cheese. Whole-fat or sweetened yogurt. Full-fat cheeses or blue cheese. Nondairy creamers and whipped toppings. Processed cheese,  cheese spreads, or cheese curds.  Condiments Onion and garlic salt, seasoned salt, table salt, and sea salt. Canned and packaged gravies. Worcestershire sauce. Tartar sauce. Barbecue sauce. Teriyaki sauce. Soy sauce, including reduced sodium. Steak sauce. Fish sauce. Oyster sauce. Cocktail sauce. Horseradish. Ketchup and mustard. Meat flavorings and tenderizers. Bouillon cubes. Hot sauce. Tabasco sauce. Marinades. Taco seasonings. Relishes.  Fats and Oils Butter, stick margarine, lard, shortening, ghee, and bacon fat. Coconut, palm kernel, or palm oils. Regular salad dressings.  Other Pickles and olives. Salted popcorn and pretzels.  The items listed above may not be a complete list of foods and beverages to avoid. Contact your dietitian for more information.  WHERE CAN I FIND MORE INFORMATION? [employer], [employer]: [employer] Document Released: 11/04/2011 Document Revised: 04/01/2014 Document Reviewed: 09/19/2013 [facility] Patient Information 2015 [place], [place]. This information is not intended to replace advice given to you by your health care provider. Make sure you discuss any questions you have with your health care provider.   I think that you would greatly benefit from seeing a nutritionist.  If you are interested, please call Dr Gerilyn Pilgrim at [phone] to schedule an appointment.    Health Maintenance, Female Adopting a healthy lifestyle and getting preventive care are important in promoting health and wellness. Ask your health care provider about:  The right schedule for you to have regular tests and exams.  Things you can do on your own to prevent diseases and keep yourself healthy. What should I know about diet, weight, and exercise? Eat a healthy diet   Eat a diet that includes plenty of vegetables, fruits, low-fat dairy products, and lean protein.  Do not eat a lot of foods that are high in solid fats, added  sugars, or sodium. Maintain a healthy weight Body mass index (BMI) is used to identify weight problems. It estimates body fat based on height and weight. Your health care provider can help determine your BMI and help you achieve or maintain a healthy weight. Get regular exercise Get regular exercise. This is one of the most important things you can do for your health. Most adults should:  Exercise for at least 150 minutes each week. The exercise should increase your heart rate and make you sweat (moderate-intensity exercise).  Do strengthening exercises at least twice a week. This is in addition to the moderate-intensity exercise.  Spend less time sitting. Even light physical activity can be beneficial. Watch cholesterol and blood lipids Have your blood tested for lipids and cholesterol at 55 years of age, then have this test every 5 years. Have your cholesterol levels checked more often if:  Your lipid or cholesterol levels are high.  You are older than 55 years of age.  You are at high risk for heart disease. What should I know about cancer screening? Depending on your health history and family history, you may need to have cancer screening at various ages. This may include screening for:  Breast cancer.  Cervical cancer.  Colorectal cancer.  Skin cancer.  Lung cancer. What should I know about heart disease, diabetes, and high blood pressure? Blood pressure and heart disease  High blood pressure causes heart disease and increases the risk of stroke. This is more likely to develop in people who have high blood pressure readings, are of African descent, or are  overweight.  Have your blood pressure checked: ? Every 3-5 years if you are 88-46 years of age. ? Every year if you are 27 years old or older. Diabetes Have regular diabetes screenings. This checks your fasting blood sugar level. Have the screening done:  Once every three years after age 7 if you are at a normal  weight and have a low risk for diabetes.  More often and at a younger age if you are overweight or have a high risk for diabetes. What should I know about preventing infection? Hepatitis B If you have a higher risk for hepatitis B, you should be screened for this virus. Talk with your health care provider to find out if you are at risk for hepatitis B infection. Hepatitis C Testing is recommended for:  Everyone born from [birth year] through 1967.  Anyone with known risk factors for hepatitis C. Sexually transmitted infections (STIs)  Get screened for STIs, including gonorrhea and chlamydia, if: ? You are sexually active and are younger than 55 years of age. ? You are older than 55 years of age and your health care provider tells you that you are at risk for this type of infection. ? Your sexual activity has changed since you were last screened, and you are at increased risk for chlamydia or gonorrhea. Ask your health care provider if you are at risk.  Ask your health care provider about whether you are at high risk for HIV. Your health care provider may recommend a prescription medicine to help prevent HIV infection. If you choose to take medicine to prevent HIV, you should first get tested for HIV. You should then be tested every 3 months for as long as you are taking the medicine. Pregnancy  If you are about to stop having your period (premenopausal) and you may become pregnant, seek counseling before you get pregnant.  Take 400 to 800 micrograms (mcg) of folic acid every day if you become pregnant.  Ask for birth control (contraception) if you want to prevent pregnancy. Osteoporosis and menopause Osteoporosis is a disease in which the bones lose minerals and strength with aging. This can result in bone fractures. If you are 63 years old or older, or if you are at risk for osteoporosis and fractures, ask your health care provider if you should:  Be screened for bone loss.  Take a calcium  or vitamin D supplement to lower your risk of fractures.  Be given hormone replacement therapy (HRT) to treat symptoms of menopause. Follow these instructions at home: Lifestyle  Do not use any products that contain nicotine or tobacco, such as cigarettes, e-cigarettes, and chewing tobacco. If you need help quitting, ask your health care provider.  Do not use street drugs.  Do not share needles.  Ask your health care provider for help if you need support or information about quitting drugs. Alcohol use  Do not drink alcohol if: ? Your health care provider tells you not to drink. ? You are pregnant, may be pregnant, or are planning to become pregnant.  If you drink alcohol: ? Limit how much you use to 0-1 drink a day. ? Limit intake if you are breastfeeding.  Be aware of how much alcohol is in your drink. In the [place] one drink equals one 12 oz bottle of beer (355 mL), one 5 oz glass of wine (148 mL), or one 1 oz glass of hard liquor (44 mL). General instructions  Schedule regular health, dental, and eye exams.  Stay current with your vaccines.  Tell your health care provider if: ? You often feel depressed. ? You have ever been abused or do not feel safe at home. Summary  Adopting a healthy lifestyle and getting preventive care are important in promoting health and wellness.  Follow your health care provider's instructions about healthy diet, exercising, and getting tested or screened for diseases.  Follow your health care provider's instructions on monitoring your cholesterol and blood pressure. This information is not intended to replace advice given to you by your health care provider. Make sure you discuss any questions you have with your health care provider. Document Released: 05/31/2011 Document Revised: 11/08/2018 Document Reviewed: 11/08/2018 [employer] Patient Education  2020 [employer].

## 2019-11-26 NOTE — Progress Notes (Signed)
Subjective:  Patient ID: Candice Jackson, female    DOB: 16-Jul-1966, 53 y.o.   MRN: [id number]  Patient Care Team: Baruch Gouty, FNP as PCP - General (Family Medicine)   Chief Complaint:  Annual Exam (no pap )   HPI: Candice Jackson is a 53 y.o. female presenting on 11/26/2019 for Annual Exam (no pap )   Pt presents today for her annual physical exam for her place of employment. Pt states she is doing well overall. States her right knee does continue to hurt. States this pain is worse after standing on her feet at work all day. States she has been using the Voltaren gel without complete relief of the symptoms. No injury. States pain is aching and throbbing, 6/10 at worse. States she does have slight swelling at times. No redness or increased warmth.  She reports her reflux is very well controlled with the omeprazole. No red flags.  She takes Bystolic for her hypertension and this controls her BP well. States at times her BP is slightly elevated and she will have a headache until it returns to normal. No other associated symptoms.  She is due for her mammogram.  Reports she had her colonoscopy in [place], [place]. Will request records.  PAP completed last year.      Relevant past medical, surgical, family, and social history reviewed and updated as indicated.  Allergies and medications reviewed and updated. Date reviewed: Chart in Epic.   Past Medical History:  Diagnosis Date  . Allergic rhinitis   . Allergy   . Chronic bronchitis ([place])   . Constipation   . GERD (gastroesophageal reflux disease)   . Hemorrhoid   . HTN (hypertension)   . Hyperlipidemia   . Sinusitis   . Thyroid disease     Past Surgical History:  Procedure Laterality Date  . HEMORROIDECTOMY    . R ankle surgery    . TOTAL ABDOMINAL HYSTERECTOMY      Social History   Socioeconomic History  . Marital status: Married    Spouse name: Candice Jackson  . Number of children: 2  . Years of education: Not on file    . Highest education level: Not on file  Occupational History  . Occupation: Therapist, music: [employer]  Tobacco Use  . Smoking status: Current Every Day Smoker    Packs/day: 0.80    Years: 20.00    Pack years: 16.00    Types: Cigarettes  . Smokeless tobacco: Never Used  Substance and Sexual Activity  . Alcohol use: Yes    Comment: 3-4 beers a night  . Drug use: Never  . Sexual activity: Not on file  Other Topics Concern  . Not on file  Social History Narrative  . Not on file   Social Determinants of Health   Financial Resource Strain:   . Difficulty of Paying Living Expenses: Not on file  Food Insecurity:   . Worried About Charity fundraiser in the Last Year: Not on file  . Ran Out of Food in the Last Year: Not on file  Transportation Needs:   . Lack of Transportation (Medical): Not on file  . Lack of Transportation (Non-Medical): Not on file  Physical Activity:   . Days of Exercise per Week: Not on file  . Minutes of Exercise per Session: Not on file  Stress:   . Feeling of Stress : Not on file  Social Connections:   .  Frequency of Communication with Friends and Family: Not on file  . Frequency of Social Gatherings with Friends and Family: Not on file  . Attends Religious Services: Not on file  . Active Member of Clubs or Organizations: Not on file  . Attends Archivist Meetings: Not on file  . Marital Status: Not on file  Intimate Partner Violence:   . Fear of Current or Ex-Partner: Not on file  . Emotionally Abused: Not on file  . Physically Abused: Not on file  . Sexually Abused: Not on file    Outpatient Encounter Medications as of 11/26/2019  Medication Sig  . albuterol (PROAIR HFA) 108 (90 Base) MCG/ACT inhaler Inhale 2 puffs into the lungs every 6 (six) hours as needed.  . diclofenac sodium (VOLTAREN) 1 % GEL Apply 4 g topically 4 (four) times daily.  . fluticasone (FLONASE) 50 MCG/ACT nasal spray Place 2 sprays into  both nostrils daily.  Marland Kitchen ibuprofen (ADVIL,MOTRIN) 600 MG tablet Take 600 mg by mouth at bedtime.  Marland Kitchen levothyroxine (SYNTHROID) 112 MCG tablet Take 1 tablet (112 mcg total) by mouth daily.  . Nebivolol HCl (BYSTOLIC) 20 MG TABS Take 1 tablet (20 mg total) by mouth daily.  Marland Kitchen nystatin-triamcinolone ointment (MYCOLOG) APPLY TO AFFECTED AREA TWICE A DAY  . omeprazole (PRILOSEC) 20 MG capsule Take 1 capsule (20 mg total) by mouth daily.  . [DISCONTINUED] Nebivolol HCl (BYSTOLIC) 20 MG TABS Take 1 tablet (20 mg total) by mouth daily.  . [DISCONTINUED] omeprazole (PRILOSEC) 20 MG capsule Take 1 capsule (20 mg total) by mouth daily.  . predniSONE (DELTASONE) 20 MG tablet 2 po at sametime daily for 5 days  . [DISCONTINUED] benzonatate (TESSALON PERLES) 100 MG capsule Take 1 capsule (100 mg total) by mouth 3 (three) times daily as needed for cough.  . [DISCONTINUED] Tiotropium Bromide Monohydrate (SPIRIVA RESPIMAT) 1.25 MCG/ACT AERS Inhale 1 Inhaler into the lungs daily.   No facility-administered encounter medications on file as of 11/26/2019.    Allergies  Allergen Reactions  . Cephalexin Palpitations  . Levofloxacin Hives  . Amoxicillin-Pot Clavulanate Nausea And Vomiting  . Ketek [Telithromycin] Palpitations  . Statins Rash    Review of Systems  Constitutional: Negative for activity change, appetite change, chills, diaphoresis, fatigue, fever and unexpected weight change.  HENT: Negative.  Negative for sore throat, trouble swallowing and voice change.   Eyes: Negative.  Negative for photophobia and visual disturbance.  Respiratory: Negative for cough, choking, chest tightness and shortness of breath.   Cardiovascular: Negative for chest pain, palpitations and leg swelling.  Gastrointestinal: Negative for abdominal distention, abdominal pain, blood in stool, constipation, diarrhea, nausea and vomiting.  Endocrine: Negative.   Genitourinary: Negative for decreased urine volume, difficulty  urinating, dysuria, frequency and urgency.  Musculoskeletal: Positive for arthralgias, gait problem and joint swelling. Negative for myalgias.  Skin: Negative.  Negative for color change, rash and wound.  Allergic/Immunologic: Negative.   Neurological: Positive for headaches. Negative for dizziness, tremors, seizures, syncope, facial asymmetry, speech difficulty, weakness, light-headedness and numbness.  Hematological: Negative.   Psychiatric/Behavioral: Negative for confusion, hallucinations, sleep disturbance and suicidal ideas.  All other systems reviewed and are negative.       Objective:  BP 123/88   Pulse 75   Temp 98.9 F (37.2 C)   Resp 20   Ht 5' (1.524 m)   Wt 131 lb (59.4 kg)   SpO2 99%   BMI 25.58 kg/m    Wt Readings from Last 3 Encounters:  11/26/19 131 lb (59.4 kg)  10/01/19 130 lb (59 kg)  12/26/18 127 lb 6.4 oz (57.8 kg)    Physical Exam Vitals and nursing note reviewed.  Constitutional:      General: She is not in acute distress.    Appearance: Normal appearance. She is well-developed and well-groomed. She is not ill-appearing, toxic-appearing or diaphoretic.  HENT:     Head: Normocephalic and atraumatic.     Jaw: There is normal jaw occlusion.     Right Ear: Hearing, tympanic membrane, ear canal and external ear normal.     Left Ear: Hearing, tympanic membrane and ear canal normal.     Nose: Nose normal.     Mouth/Throat:     Lips: Pink.     Mouth: Mucous membranes are moist.     Pharynx: Oropharynx is clear. Uvula midline.  Eyes:     General: Lids are normal.     Extraocular Movements: Extraocular movements intact.     Conjunctiva/sclera: Conjunctivae normal.     Pupils: Pupils are equal, round, and reactive to light.  Neck:     Thyroid: No thyroid mass, thyromegaly or thyroid tenderness.     Vascular: No carotid bruit or JVD.     Trachea: Trachea and phonation normal.  Cardiovascular:     Rate and Rhythm: Normal rate and regular rhythm.      Chest Wall: PMI is not displaced.     Pulses: Normal pulses.     Heart sounds: Normal heart sounds. No murmur. No friction rub. No gallop.   Pulmonary:     Effort: Pulmonary effort is normal. No respiratory distress.     Breath sounds: Normal breath sounds. No wheezing.  Abdominal:     General: Bowel sounds are normal. There is no distension or abdominal bruit.     Palpations: Abdomen is soft. There is no hepatomegaly or splenomegaly.     Tenderness: There is no abdominal tenderness. There is no right CVA tenderness or left CVA tenderness.     Hernia: No hernia is present.  Musculoskeletal:     Cervical back: Normal range of motion and neck supple.     Right upper leg: Normal.     Right knee: Swelling present. No deformity, effusion, erythema, ecchymosis, lacerations, bony tenderness or crepitus. Decreased range of motion. Tenderness present over the medial joint line. No LCL laxity, MCL laxity, ACL laxity or PCL laxity. Normal alignment, normal meniscus and normal patellar mobility. Normal pulse.     Instability Tests: Negative medial McMurray test and negative lateral McMurray test.     Left knee: Normal.     Right lower leg: Normal. No edema.     Left lower leg: No edema.  Lymphadenopathy:     Cervical: No cervical adenopathy.  Skin:    General: Skin is warm and dry.     Capillary Refill: Capillary refill takes less than 2 seconds.     Coloration: Skin is not cyanotic, jaundiced or pale.     Findings: No rash.  Neurological:     General: No focal deficit present.     Mental Status: She is alert and oriented to person, place, and time.     Cranial Nerves: Cranial nerves are intact. No cranial nerve deficit.     Sensory: Sensation is intact. No sensory deficit.     Motor: Motor function is intact. No weakness.     Coordination: Coordination is intact. Coordination normal.     Gait: Gait abnormal (antalgic).  Deep Tendon Reflexes: Reflexes are normal and symmetric. Reflexes  normal.  Psychiatric:        Attention and Perception: Attention and perception normal.        Mood and Affect: Mood and affect normal.        Speech: Speech normal.        Behavior: Behavior normal. Behavior is cooperative.        Thought Content: Thought content normal.        Cognition and Memory: Cognition and memory normal.        Judgment: Judgment normal.     Results for orders placed or performed in visit on 10/01/19  CMP14+EGFR  Result Value Ref Range   Glucose 81 65 - 99 mg/dL   BUN 7 6 - 24 mg/dL   Creatinine, Ser 0.65 0.57 - 1.00 mg/dL   GFR calc non Af Amer 100 >59 mL/min/1.73   GFR calc Af Amer 116 >59 mL/min/1.73   BUN/Creatinine Ratio 11 9 - 23   Sodium 140 134 - 144 mmol/L   Potassium 4.7 3.5 - 5.2 mmol/L   Chloride 101 96 - 106 mmol/L   CO2 23 20 - 29 mmol/L   Calcium 9.6 8.7 - 10.2 mg/dL   Total Protein 7.4 6.0 - 8.5 g/dL   Albumin 4.7 3.8 - 4.9 g/dL   Globulin, Total 2.7 1.5 - 4.5 g/dL   Albumin/Globulin Ratio 1.7 1.2 - 2.2   Bilirubin Total 0.3 0.0 - 1.2 mg/dL   Alkaline Phosphatase 91 39 - 117 IU/L   AST 23 0 - 40 IU/L   ALT 23 0 - 32 IU/L  CBC with Differential/Platelet  Result Value Ref Range   WBC 9.3 3.4 - 10.8 x10E3/uL   RBC 4.47 3.77 - 5.28 x10E6/uL   Hemoglobin 14.1 11.1 - 15.9 g/dL   Hematocrit 40.6 34.0 - 46.6 %   MCV 91 79 - 97 fL   MCH 31.5 26.6 - 33.0 pg   MCHC 34.7 31.5 - 35.7 g/dL   RDW 12.2 11.7 - 15.4 %   Platelets 273 150 - 450 x10E3/uL   Neutrophils 51 Not Estab. %   Lymphs 38 Not Estab. %   Monocytes 8 Not Estab. %   Eos 2 Not Estab. %   Basos 1 Not Estab. %   Neutrophils Absolute 4.8 1.4 - 7.0 x10E3/uL   Lymphocytes Absolute 3.5 (H) 0.7 - 3.1 x10E3/uL   Monocytes Absolute 0.7 0.1 - 0.9 x10E3/uL   EOS (ABSOLUTE) 0.2 0.0 - 0.4 x10E3/uL   Basophils Absolute 0.1 0.0 - 0.2 x10E3/uL   Immature Granulocytes 0 Not Estab. %   Immature Grans (Abs) 0.0 0.0 - 0.1 x10E3/uL  Lipid panel  Result Value Ref Range   Cholesterol, Total  255 (H) 100 - 199 mg/dL   Triglycerides 186 (H) 0 - 149 mg/dL   HDL 51 >39 mg/dL   VLDL Cholesterol Cal 34 5 - 40 mg/dL   LDL Chol Calc (NIH) 170 (H) 0 - 99 mg/dL   Chol/HDL Ratio 5.0 (H) 0.0 - 4.4 ratio  Thyroid Panel With TSH  Result Value Ref Range   TSH 0.013 (L) 0.450 - 4.500 uIU/mL   T4, Total 11.2 4.5 - 12.0 ug/dL   T3 Uptake Ratio 34 24 - 39 %   Free Thyroxine Index 3.8 1.2 - 4.9     X-Ray: right knee: No acute findings, slight joint space narrowing. Preliminary x-ray reading by Monia Pouch, FNP-C, [facility].   Pertinent labs &  imaging results that were available during my care of the patient were reviewed by me and considered in my medical decision making.  Assessment & Plan:  Ailin was seen today for annual exam.  Diagnoses and all orders for this visit:  Encounter for general adult medical examination with abnormal findings Health maintenance discussed. Diet and exercise encouraged. Will get PAP and colonoscopy results.  -     MM SCREENING BREAST TOMO BILATERAL; Future  Chronic pain of right knee Xray with slight joint space narrowing. No acute findings. Brace applied in office. Will burst with steroids to see if beneficial. If continued symptoms, will refer to PT. -     DG Knee 1-2 Views Right; Future -     predniSONE (DELTASONE) 20 MG tablet; 2 po at sametime daily for 5 days  Encounter for screening mammogram for malignant neoplasm of breast -     MM SCREENING BREAST TOMO BILATERAL; Future  Essential (primary) hypertension BP well controlled. Changes were not made in regimen today. Goal BP is 130/80. Pt aware to report any persistent high or low readings. DASH diet and exercise encouraged. Exercise at least 150 minutes per week and increase as tolerated. Goal BMI > 25. Stress management encouraged. Avoid nicotine and tobacco product use. Avoid excessive alcohol and NSAID's. Avoid more than 2000 mg of sodium daily. Medications as prescribed. Follow up as scheduled.   -     Nebivolol HCl (BYSTOLIC) 20 MG TABS; Take 1 tablet (20 mg total) by mouth daily.  Gastroesophageal reflux disease without esophagitis No red flags present. Diet discussed. Avoid fried, spicy, fatty, greasy, and acidic foods. Avoid caffeine, nicotine, and alcohol. Do not eat 2-3 hours before bedtime and stay upright for at least 1-2 hours after eating. Eat small frequent meals. Avoid NSAID's like motrin and aleve. Medications as prescribed. Report any new or worsening symptoms. Follow up as discussed or sooner if needed.   -     omeprazole (PRILOSEC) 20 MG capsule; Take 1 capsule (20 mg total) by mouth daily.     Continue all other maintenance medications.  Follow up plan: Return in about 3 months (around 02/24/2020), or if symptoms worsen or fail to improve, for HTN.  Continue healthy lifestyle choices, including diet (rich in fruits, vegetables, and lean proteins, and low in salt and simple carbohydrates) and exercise (at least 30 minutes of moderate physical activity daily).  Educational handout given for DASH  The above assessment and management plan was discussed with the patient. The patient verbalized understanding of and has agreed to the management plan. Patient is aware to call the clinic if they develop any new symptoms or if symptoms persist or worsen. Patient is aware when to return to the clinic for a follow-up visit. Patient educated on when it is appropriate to go to the emergency department.   Monia Pouch, FNP-C [facility] [phone]

## 2020-01-01 ENCOUNTER — Other Ambulatory Visit: Payer: Self-pay

## 2020-01-02 ENCOUNTER — Encounter: Payer: Self-pay | Admitting: Family Medicine

## 2020-01-02 ENCOUNTER — Ambulatory Visit (INDEPENDENT_AMBULATORY_CARE_PROVIDER_SITE_OTHER): Payer: 59 | Admitting: Family Medicine

## 2020-01-02 VITALS — BP 126/80 | HR 80 | Temp 97.7°F | Resp 18 | Ht 60.0 in | Wt 130.6 lb

## 2020-01-02 DIAGNOSIS — R059 Cough, unspecified: Secondary | ICD-10-CM

## 2020-01-02 DIAGNOSIS — Z1212 Encounter for screening for malignant neoplasm of rectum: Secondary | ICD-10-CM

## 2020-01-02 DIAGNOSIS — Z1211 Encounter for screening for malignant neoplasm of colon: Secondary | ICD-10-CM

## 2020-01-02 DIAGNOSIS — J0100 Acute maxillary sinusitis, unspecified: Secondary | ICD-10-CM

## 2020-01-02 DIAGNOSIS — J418 Mixed simple and mucopurulent chronic bronchitis: Secondary | ICD-10-CM

## 2020-01-02 DIAGNOSIS — R05 Cough: Secondary | ICD-10-CM

## 2020-01-02 DIAGNOSIS — I10 Essential (primary) hypertension: Secondary | ICD-10-CM

## 2020-01-02 DIAGNOSIS — E039 Hypothyroidism, unspecified: Secondary | ICD-10-CM

## 2020-01-02 MED ORDER — BUDESONIDE-FORMOTEROL FUMARATE 160-4.5 MCG/ACT IN AERO: 2.0000 | INHALATION_SPRAY | Freq: Two times a day (BID) | RESPIRATORY_TRACT | 3 refills | Status: AC

## 2020-01-02 MED ORDER — HYDROCOD POLST-CPM POLST ER 10-8 MG/5ML PO SUER: 5.0000 mL | Freq: Two times a day (BID) | ORAL | 0 refills | Status: DC | PRN

## 2020-01-02 MED ORDER — DOXYCYCLINE HYCLATE 100 MG PO TABS: 100.0000 mg | ORAL_TABLET | Freq: Two times a day (BID) | ORAL | 0 refills | Status: AC

## 2020-01-02 NOTE — Progress Notes (Signed)
Subjective:  Patient ID: Candice Jackson, female    DOB: 08-28-1966, 54 y.o.   MRN: [id number]  Patient Care Team: Baruch Gouty, FNP as PCP - General (Family Medicine)   Chief Complaint:  Medical Management of Chronic Issues, Hypertension, and Hypothyroidism   HPI: Candice Jackson is a 54 y.o. female presenting on 01/02/2020 for Medical Management of Chronic Issues, Hypertension, and Hypothyroidism   1. Acquired hypothyroidism Pt states she has adjusted well to decrease dose of levothyroxine. Denies fatigue, constipation, palpitations, tremors, hair or skin changes, mood changes, or appetite changes. States she is taking medications daily as prescribed.   Lab Results  Component Value Date   TSH 0.013 (L) 10/01/2019     2. Essential (primary) hypertension Compliant with medications and tolerating well. No headache, chest pain, dizziness, weakness, confusion, palpitations, syncope, or leg swelling. Does monitor sodium intake.   3. Mixed simple and mucopurulent chronic bronchitis ([place]) Doing well on current medications. States she does have an increased cough over the last few days but no increased sputum production or shortness of breath.  4. Sinus pressure Pt reports maxillary sinus pressure and pain with pressure in head with bending forward. States this started over a week ago and is getting worse. She has been taking Flonase, Mucinex, and Delsym without relief of symptoms. States the congestion and postnasal drainage is getting worse. No reported fever. States she has had chills and malaise.      Relevant past medical, surgical, family, and social history reviewed and updated as indicated.  Allergies and medications reviewed and updated. Date reviewed: Chart in Epic.   Past Medical History:  Diagnosis Date  . Allergic rhinitis   . Allergy   . Chronic bronchitis ([place])   . Constipation   . GERD (gastroesophageal reflux disease)   . Hemorrhoid   . HTN  (hypertension)   . Hyperlipidemia   . Sinusitis   . Thyroid disease     Past Surgical History:  Procedure Laterality Date  . HEMORROIDECTOMY    . R ankle surgery    . TOTAL ABDOMINAL HYSTERECTOMY      Social History   Socioeconomic History  . Marital status: Married    Spouse name: [name]  . Number of children: 2  . Years of education: Not on file  . Highest education level: Not on file  Occupational History  . Occupation: Therapist, music: [employer]  Tobacco Use  . Smoking status: Current Every Day Smoker    Packs/day: 0.80    Years: 20.00    Pack years: 16.00    Types: Cigarettes  . Smokeless tobacco: Never Used  Substance and Sexual Activity  . Alcohol use: Yes    Comment: 3-4 beers a night  . Drug use: Never  . Sexual activity: Not on file  Other Topics Concern  . Not on file  Social History Narrative  . Not on file   Social Determinants of Health   Financial Resource Strain:   . Difficulty of Paying Living Expenses: Not on file  Food Insecurity:   . Worried About Charity fundraiser in the Last Year: Not on file  . Ran Out of Food in the Last Year: Not on file  Transportation Needs:   . Lack of Transportation (Medical): Not on file  . Lack of Transportation (Non-Medical): Not on file  Physical Activity:   . Days of Exercise per Week: Not on file  .  Minutes of Exercise per Session: Not on file  Stress:   . Feeling of Stress : Not on file  Social Connections:   . Frequency of Communication with Friends and Family: Not on file  . Frequency of Social Gatherings with Friends and Family: Not on file  . Attends Religious Services: Not on file  . Active Member of Clubs or Organizations: Not on file  . Attends Archivist Meetings: Not on file  . Marital Status: Not on file  Intimate Partner Violence:   . Fear of Current or Ex-Partner: Not on file  . Emotionally Abused: Not on file  . Physically Abused: Not on file  . Sexually  Abused: Not on file    Outpatient Encounter Medications as of 01/02/2020  Medication Sig  . albuterol (PROAIR HFA) 108 (90 Base) MCG/ACT inhaler Inhale 2 puffs into the lungs every 6 (six) hours as needed.  . diclofenac sodium (VOLTAREN) 1 % GEL Apply 4 g topically 4 (four) times daily.  . fluticasone (FLONASE) 50 MCG/ACT nasal spray Place 2 sprays into both nostrils daily.  Marland Kitchen ibuprofen (ADVIL,MOTRIN) 600 MG tablet Take 600 mg by mouth at bedtime.  Marland Kitchen levothyroxine (SYNTHROID) 112 MCG tablet Take 1 tablet (112 mcg total) by mouth daily.  . Nebivolol HCl (BYSTOLIC) 20 MG TABS Take 1 tablet (20 mg total) by mouth daily.  Marland Kitchen omeprazole (PRILOSEC) 20 MG capsule Take 1 capsule (20 mg total) by mouth daily.  . budesonide-formoterol (SYMBICORT) 160-4.5 MCG/ACT inhaler Inhale 2 puffs into the lungs 2 (two) times daily.  . chlorpheniramine-HYDROcodone (TUSSIONEX) 10-8 MG/5ML SUER Take 5 mLs by mouth every 12 (twelve) hours as needed for cough.  . doxycycline (VIBRA-TABS) 100 MG tablet Take 1 tablet (100 mg total) by mouth 2 (two) times daily for 10 days. 1 po bid  . [DISCONTINUED] chlorpheniramine-HYDROcodone (TUSSIONEX) 10-8 MG/5ML SUER Take 5 mLs by mouth every 12 (twelve) hours as needed for cough.  . [DISCONTINUED] nystatin-triamcinolone ointment (MYCOLOG) APPLY TO AFFECTED AREA TWICE A DAY (Patient not taking: Reported on 01/02/2020)  . [DISCONTINUED] predniSONE (DELTASONE) 20 MG tablet 2 po at sametime daily for 5 days   No facility-administered encounter medications on file as of 01/02/2020.    Allergies  Allergen Reactions  . Cephalexin Palpitations  . Levofloxacin Hives  . Amoxicillin-Pot Clavulanate Nausea And Vomiting  . Ketek [Telithromycin] Palpitations  . Statins Rash    Review of Systems  Constitutional: Negative for activity change, appetite change, chills, diaphoresis, fatigue, fever and unexpected weight change.  HENT: Positive for congestion, postnasal drip, rhinorrhea, sinus  pressure and sinus pain.   Eyes: Negative.  Negative for photophobia and visual disturbance.  Respiratory: Positive for cough. Negative for chest tightness and shortness of breath.   Cardiovascular: Negative for chest pain, palpitations and leg swelling.  Gastrointestinal: Negative for abdominal pain, blood in stool, constipation, diarrhea, nausea and vomiting.  Endocrine: Negative.  Negative for cold intolerance, heat intolerance, polydipsia, polyphagia and polyuria.  Genitourinary: Negative for decreased urine volume, difficulty urinating, dysuria, frequency and urgency.  Musculoskeletal: Negative for arthralgias and myalgias.  Skin: Negative.   Allergic/Immunologic: Negative.   Neurological: Negative for dizziness, tremors, seizures, syncope, facial asymmetry, speech difficulty, weakness, light-headedness, numbness and headaches.  Hematological: Negative.   Psychiatric/Behavioral: Negative for confusion, hallucinations, sleep disturbance and suicidal ideas.  All other systems reviewed and are negative.       Objective:  BP 126/80   Pulse 80   Temp 97.7 F (36.5 C)  Resp 18   Ht 5' (1.524 m)   Wt 130 lb 9.6 oz (59.2 kg)   SpO2 100%   BMI 25.51 kg/m    Wt Readings from Last 3 Encounters:  01/02/20 130 lb 9.6 oz (59.2 kg)  11/26/19 131 lb (59.4 kg)  10/01/19 130 lb (59 kg)    Physical Exam Vitals and nursing note reviewed.  Constitutional:      General: She is not in acute distress.    Appearance: Normal appearance. She is well-developed, well-groomed and normal weight. She is not ill-appearing, toxic-appearing or diaphoretic.  HENT:     Head: Normocephalic and atraumatic.     Jaw: There is normal jaw occlusion.     Right Ear: Hearing, tympanic membrane, ear canal and external ear normal.     Left Ear: Hearing, tympanic membrane, ear canal and external ear normal.     Nose: Congestion present.     Right Turbinates: Swollen.     Left Turbinates: Swollen.     Right  Sinus: Maxillary sinus tenderness present.     Left Sinus: Maxillary sinus tenderness present.     Comments: Postnasal drainage    Mouth/Throat:     Lips: Pink.     Mouth: Mucous membranes are moist.     Pharynx: Oropharynx is clear. Uvula midline.  Eyes:     General: Lids are normal.     Extraocular Movements: Extraocular movements intact.     Conjunctiva/sclera: Conjunctivae normal.     Pupils: Pupils are equal, round, and reactive to light.  Neck:     Thyroid: No thyroid mass, thyromegaly or thyroid tenderness.     Vascular: No carotid bruit or JVD.     Trachea: Trachea and phonation normal.  Cardiovascular:     Rate and Rhythm: Normal rate and regular rhythm.     Chest Wall: PMI is not displaced.     Pulses: Normal pulses.     Heart sounds: Normal heart sounds. No murmur. No friction rub. No gallop.   Pulmonary:     Effort: Pulmonary effort is normal. No respiratory distress.     Breath sounds: Examination of the right-lower field reveals wheezing. Examination of the left-lower field reveals wheezing. Wheezing (mild) present.     Comments: Nonproductive cough Abdominal:     General: Bowel sounds are normal. There is no distension or abdominal bruit.     Palpations: Abdomen is soft. There is no hepatomegaly or splenomegaly.     Tenderness: There is no abdominal tenderness. There is no right CVA tenderness or left CVA tenderness.     Hernia: No hernia is present.  Musculoskeletal:        General: Normal range of motion.     Cervical back: Normal range of motion and neck supple.     Right lower leg: No edema.     Left lower leg: No edema.  Lymphadenopathy:     Cervical: No cervical adenopathy.  Skin:    General: Skin is warm and dry.     Capillary Refill: Capillary refill takes less than 2 seconds.     Coloration: Skin is not cyanotic, jaundiced or pale.     Findings: No rash.  Neurological:     General: No focal deficit present.     Mental Status: She is alert and  oriented to person, place, and time.     Cranial Nerves: Cranial nerves are intact. No cranial nerve deficit.     Sensory: Sensation is intact. No  sensory deficit.     Motor: Motor function is intact. No weakness.     Coordination: Coordination is intact. Coordination normal.     Gait: Gait is intact. Gait normal.     Deep Tendon Reflexes: Reflexes are normal and symmetric. Reflexes normal.  Psychiatric:        Attention and Perception: Attention and perception normal.        Mood and Affect: Mood and affect normal.        Speech: Speech normal.        Behavior: Behavior normal. Behavior is cooperative.        Thought Content: Thought content normal.        Cognition and Memory: Cognition and memory normal.        Judgment: Judgment normal.     Results for orders placed or performed in visit on 10/01/19  CMP14+EGFR  Result Value Ref Range   Glucose 81 65 - 99 mg/dL   BUN 7 6 - 24 mg/dL   Creatinine, Ser 0.65 0.57 - 1.00 mg/dL   GFR calc non Af Amer 100 >59 mL/min/1.73   GFR calc Af Amer 116 >59 mL/min/1.73   BUN/Creatinine Ratio 11 9 - 23   Sodium 140 134 - 144 mmol/L   Potassium 4.7 3.5 - 5.2 mmol/L   Chloride 101 96 - 106 mmol/L   CO2 23 20 - 29 mmol/L   Calcium 9.6 8.7 - 10.2 mg/dL   Total Protein 7.4 6.0 - 8.5 g/dL   Albumin 4.7 3.8 - 4.9 g/dL   Globulin, Total 2.7 1.5 - 4.5 g/dL   Albumin/Globulin Ratio 1.7 1.2 - 2.2   Bilirubin Total 0.3 0.0 - 1.2 mg/dL   Alkaline Phosphatase 91 39 - 117 IU/L   AST 23 0 - 40 IU/L   ALT 23 0 - 32 IU/L  CBC with Differential/Platelet  Result Value Ref Range   WBC 9.3 3.4 - 10.8 x10E3/uL   RBC 4.47 3.77 - 5.28 x10E6/uL   Hemoglobin 14.1 11.1 - 15.9 g/dL   Hematocrit 40.6 34.0 - 46.6 %   MCV 91 79 - 97 fL   MCH 31.5 26.6 - 33.0 pg   MCHC 34.7 31.5 - 35.7 g/dL   RDW 12.2 11.7 - 15.4 %   Platelets 273 150 - 450 x10E3/uL   Neutrophils 51 Not Estab. %   Lymphs 38 Not Estab. %   Monocytes 8 Not Estab. %   Eos 2 Not Estab. %   Basos  1 Not Estab. %   Neutrophils Absolute 4.8 1.4 - 7.0 x10E3/uL   Lymphocytes Absolute 3.5 (H) 0.7 - 3.1 x10E3/uL   Monocytes Absolute 0.7 0.1 - 0.9 x10E3/uL   EOS (ABSOLUTE) 0.2 0.0 - 0.4 x10E3/uL   Basophils Absolute 0.1 0.0 - 0.2 x10E3/uL   Immature Granulocytes 0 Not Estab. %   Immature Grans (Abs) 0.0 0.0 - 0.1 x10E3/uL  Lipid panel  Result Value Ref Range   Cholesterol, Total 255 (H) 100 - 199 mg/dL   Triglycerides 186 (H) 0 - 149 mg/dL   HDL 51 >39 mg/dL   VLDL Cholesterol Cal 34 5 - 40 mg/dL   LDL Chol Calc (NIH) 170 (H) 0 - 99 mg/dL   Chol/HDL Ratio 5.0 (H) 0.0 - 4.4 ratio  Thyroid Panel With TSH  Result Value Ref Range   TSH 0.013 (L) 0.450 - 4.500 uIU/mL   T4, Total 11.2 4.5 - 12.0 ug/dL   T3 Uptake Ratio 34 24 - 39 %  Free Thyroxine Index 3.8 1.2 - 4.9       Pertinent labs & imaging results that were available during my care of the patient were reviewed by me and considered in my medical decision making.  Assessment & Plan:  Cruzita was seen today for medical management of chronic issues, hypertension and hypothyroidism.  Diagnoses and all orders for this visit:  Acquired hypothyroidism Thyroid disease has been poorly controlled. Labs are pending. Adjustments to regimen will be made if warranted. Make sure to take medications on an empty stomach with a full glass of water. Make sure to avoid vitamins or supplements for at least 4 hours before and 4 hours after taking medications. Repeat labs in 3 months if adjustments are made and in 6 months if stable.   -     Thyroid Panel With TSH  Essential (primary) hypertension BP well controlled. Changes were not made in regimen. Goal BP is 130/80. Pt aware to report any persistent high or low readings. DASH diet and exercise encouraged. Exercise at least 150 minutes per week and increase as tolerated. Goal BMI > 25. Stress management encouraged. Avoid nicotine and tobacco product use. Avoid excessive alcohol and NSAID's. Avoid  more than 2000 mg of sodium daily. Medications as prescribed. Follow up as scheduled.   Mixed simple and mucopurulent chronic bronchitis ([facility]) Doing well on Symbicort and as needed Albuterol. Does have increased cough without shortness of breath or sputum production. Will continue below.  -     budesonide-formoterol (SYMBICORT) 160-4.5 MCG/ACT inhaler; Inhale 2 puffs into the lungs 2 (two) times daily.  Acute non-recurrent maxillary sinusitis Cough Ongoing and worsening symptoms for over 8 days. Has failed outpatient Mucinex, Flonase, and Delsym therapy. Will initiate below and continue above. Adequate hydration discussed in detail. Report any new, worsening, or persistent symptoms.  -     doxycycline (VIBRA-TABS) 100 MG tablet; Take 1 tablet (100 mg total) by mouth 2 (two) times daily for 10 days. 1 po bid -     chlorpheniramine-HYDROcodone (TUSSIONEX) 10-8 MG/5ML SUER; Take 5 mLs by mouth every 12 (twelve) hours as needed for cough.  Screening for colorectal cancer Not high risk. Agrees to cologuard. Declined colonoscopy.  -     Cologuard     Continue all other maintenance medications.  Follow up plan: Return in about 6 months (around 07/01/2020), or if symptoms worsen or fail to improve, for HTN, Thyroid.  Continue healthy lifestyle choices, including diet (rich in fruits, vegetables, and lean proteins, and low in salt and simple carbohydrates) and exercise (at least 30 minutes of moderate physical activity daily).  Educational handout given for hypothyroidism  The above assessment and management plan was discussed with the patient. The patient verbalized understanding of and has agreed to the management plan. Patient is aware to call the clinic if they develop any new symptoms or if symptoms persist or worsen. Patient is aware when to return to the clinic for a follow-up visit. Patient educated on when it is appropriate to go to the emergency department.   Monia Pouch,  FNP-C [facility] [phone]

## 2020-01-02 NOTE — Patient Instructions (Signed)
Hypothyroidism  Hypothyroidism is when the thyroid gland does not make enough of certain hormones (it is underactive). The thyroid gland is a small gland located in the lower front part of the neck, just in front of the windpipe (trachea). This gland makes hormones that help control how the body uses food for energy (metabolism) as well as how the heart and brain function. These hormones also play a role in keeping your bones strong. When the thyroid is underactive, it produces too little of the hormones thyroxine (T4) and triiodothyronine (T3). What are the causes? This condition may be caused by:  Hashimoto's disease. This is a disease in which the body's disease-fighting system (immune system) attacks the thyroid gland. This is the most common cause.  Viral infections.  Pregnancy.  Certain medicines.  Birth defects.  Past radiation treatments to the head or neck for cancer.  Past treatment with radioactive iodine.  Past exposure to radiation in the environment.  Past surgical removal of part or all of the thyroid.  Problems with a gland in the center of the brain (pituitary gland).  Lack of enough iodine in the diet. What increases the risk? You are more likely to develop this condition if:  You are female.  You have a family history of thyroid conditions.  You use a medicine called lithium.  You take medicines that affect the immune system (immunosuppressants). What are the signs or symptoms? Symptoms of this condition include:  Feeling as though you have no energy (lethargy).  Not being able to tolerate cold.  Weight gain that is not explained by a change in diet or exercise habits.  Lack of appetite.  Dry skin.  Coarse hair.  Menstrual irregularity.  Slowing of thought processes.  Constipation.  Sadness or depression. How is this diagnosed? This condition may be diagnosed based on:  Your symptoms, your medical history, and a physical exam.  Blood  tests. You may also have imaging tests, such as an ultrasound or MRI. How is this treated? This condition is treated with medicine that replaces the thyroid hormones that your body does not make. After you begin treatment, it may take several weeks for symptoms to go away. Follow these instructions at home:  Take over-the-counter and prescription medicines only as told by your health care provider.  If you start taking any new medicines, tell your health care provider.  Keep all follow-up visits as told by your health care provider. This is important. ? As your condition improves, your dosage of thyroid hormone medicine may change. ? You will need to have blood tests regularly so that your health care provider can monitor your condition. Contact a health care provider if:  Your symptoms do not get better with treatment.  You are taking thyroid replacement medicine and you: ? Sweat a lot. ? Have tremors. ? Feel anxious. ? Lose weight rapidly. ? Cannot tolerate heat. ? Have emotional swings. ? Have diarrhea. ? Feel weak. Get help right away if you have:  Chest pain.  An irregular heartbeat.  A rapid heartbeat.  Difficulty breathing. Summary  Hypothyroidism is when the thyroid gland does not make enough of certain hormones (it is underactive).  When the thyroid is underactive, it produces too little of the hormones thyroxine (T4) and triiodothyronine (T3).  The most common cause is Hashimoto's disease, a disease in which the body's disease-fighting system (immune system) attacks the thyroid gland. The condition can also be caused by viral infections, medicine, pregnancy, or past   radiation treatment to the head or neck.  Symptoms may include weight gain, dry skin, constipation, feeling as though you do not have energy, and not being able to tolerate cold.  This condition is treated with medicine to replace the thyroid hormones that your body does not make. This information  is not intended to replace advice given to you by your health care provider. Make sure you discuss any questions you have with your health care provider. Document Revised: 10/28/2017 Document Reviewed: 10/26/2017 Elsevier Patient Education  2020 [employer]vier Inc.  

## 2020-01-03 LAB — THYROID PANEL WITH TSH
Free Thyroxine Index: 2.6 (ref 1.2–4.9)
T3 Uptake Ratio: 29 % (ref 24–39)
T4, Total: 8.9 ug/dL (ref 4.5–12.0)
TSH: 0.012 u[IU]/mL — ABNORMAL LOW (ref 0.450–4.500)

## 2020-01-03 MED ORDER — LEVOTHYROXINE SODIUM 100 MCG PO TABS: 100.0000 ug | ORAL_TABLET | Freq: Every day | ORAL | 3 refills | Status: DC

## 2020-01-03 NOTE — Addendum Note (Signed)
Addended by: Sonny Masters on: 01/03/2020 07:49 AM   Modules accepted: Orders

## 2020-01-31 ENCOUNTER — Other Ambulatory Visit: Payer: Self-pay | Admitting: Family

## 2020-01-31 ENCOUNTER — Encounter: Payer: Self-pay | Admitting: Family

## 2020-01-31 ENCOUNTER — Other Ambulatory Visit: Payer: Self-pay

## 2020-01-31 ENCOUNTER — Ambulatory Visit (HOSPITAL_COMMUNITY)
Admission: RE | Admit: 2020-01-31 | Discharge: 2020-01-31 | Disposition: A | Discharge status: Home / Self Care | Payer: 59 | Source: Ambulatory Visit | Attending: Family | Admitting: Family

## 2020-01-31 ENCOUNTER — Ambulatory Visit (INDEPENDENT_AMBULATORY_CARE_PROVIDER_SITE_OTHER): Payer: 59 | Admitting: Family

## 2020-01-31 VITALS — BP 154/97 | HR 84 | Temp 95.7°F | Ht <= 58 in | Wt 128.6 lb

## 2020-01-31 DIAGNOSIS — M79662 Pain in left lower leg: Secondary | ICD-10-CM | POA: Diagnosis not present

## 2020-01-31 MED ORDER — BACLOFEN 10 MG PO TABS: 10.0000 mg | ORAL_TABLET | Freq: Three times a day (TID) | ORAL | 0 refills | Status: DC

## 2020-01-31 MED ORDER — DICLOFENAC SODIUM 75 MG PO TBEC: 75.0000 mg | DELAYED_RELEASE_TABLET | Freq: Two times a day (BID) | ORAL | 0 refills | Status: DC

## 2020-01-31 NOTE — Progress Notes (Signed)
   Subjective:    Patient ID: Candice Jackson, female    DOB: 11-20-1966, 54 y.o.   MRN: [id number]  Chief Complaint  Patient presents with  . Leg Pain    in the muscle behand calf been going on 2 weeks    HPI PT presents to the office today with left calf pain that started two weeks ago. Denies any injury. Reports intermittent aching, burning, stabbing pain of a 10 out 10. She states walking makes it worse.   Denies any SOB, hx DVT, swelling, or redness.    Review of Systems  All other systems reviewed and are negative.      Objective:   Physical Exam Vitals reviewed.  Constitutional:      General: She is not in acute distress.    Appearance: She is well-developed.  HENT:     Head: Normocephalic and atraumatic.  Eyes:     Pupils: Pupils are equal, round, and reactive to light.  Neck:     Thyroid: No thyromegaly.  Cardiovascular:     Rate and Rhythm: Normal rate and regular rhythm.     Heart sounds: Normal heart sounds. No murmur.  Pulmonary:     Effort: Pulmonary effort is normal. No respiratory distress.     Breath sounds: Normal breath sounds. No wheezing.  Abdominal:     General: Bowel sounds are normal. There is no distension.     Palpations: Abdomen is soft.     Tenderness: There is no abdominal tenderness.  Musculoskeletal:        General: Tenderness present. Normal range of motion.     Cervical back: Normal range of motion and neck supple.     Comments: Positive Homans's sign, no swelling, erythemas or warmth noted. Tenderness present with palpation.   Skin:    General: Skin is warm and dry.  Neurological:     Mental Status: She is alert and oriented to person, place, and time.     Cranial Nerves: No cranial nerve deficit.     Deep Tendon Reflexes: Reflexes are normal and symmetric.  Psychiatric:        Behavior: Behavior normal.        Thought Content: Thought content normal.        Judgment: Judgment normal.     BP (!) 154/97   Pulse 84   Temp  (!) 95.7 F (35.4 C) (Temporal)   Ht 2' (0.61 m)   Wt 128 lb 9.6 oz (58.3 kg)   SpO2 100%   BMI 156.97 kg/m        Assessment & Plan:  1. Pain of left calf Will do stat doppler  If negative will treat as muscle strain If any SOB go to ED - US Venous Img Lower Unilateral Left; Future   Jannifer Rodney, FNP

## 2020-01-31 NOTE — Patient Instructions (Signed)

## 2020-02-04 ENCOUNTER — Telehealth: Payer: Self-pay | Admitting: Family Medicine

## 2020-02-05 ENCOUNTER — Encounter: Payer: Self-pay | Admitting: Family Medicine

## 2020-02-05 MED ORDER — CYCLOBENZAPRINE HCL 5 MG PO TABS: 5.0000 mg | ORAL_TABLET | Freq: Three times a day (TID) | ORAL | 1 refills | Status: DC | PRN

## 2020-02-05 NOTE — Telephone Encounter (Signed)
Ok to extend note 

## 2020-02-05 NOTE — Telephone Encounter (Signed)
This is fine, I have changed baclofen to flexeril. New Prescription sent to pharmacy.

## 2020-02-05 NOTE — Telephone Encounter (Signed)
Patient requesting the whole week off. Is this okay. Medication that was giving makes her sleep. She is asking to change to flexeril 10mg  to [facility].

## 2020-02-05 NOTE — Telephone Encounter (Signed)
Left, message need to know dates patient needs for note.

## 2020-02-05 NOTE — Telephone Encounter (Signed)
Patient aware and verbalized understanding letter printed and put up front

## 2020-02-05 NOTE — Addendum Note (Signed)
Addended by: Jannifer Rodney A on: 02/05/2020 10:16 AM   Modules accepted: Orders

## 2020-02-12 LAB — COLOGUARD: Cologuard: NEGATIVE

## 2020-03-11 ENCOUNTER — Ambulatory Visit (INDEPENDENT_AMBULATORY_CARE_PROVIDER_SITE_OTHER): Payer: 59 | Admitting: Family Medicine

## 2020-03-11 DIAGNOSIS — R509 Fever, unspecified: Secondary | ICD-10-CM | POA: Diagnosis not present

## 2020-03-11 DIAGNOSIS — R519 Headache, unspecified: Secondary | ICD-10-CM

## 2020-03-11 DIAGNOSIS — R0981 Nasal congestion: Secondary | ICD-10-CM | POA: Diagnosis not present

## 2020-03-11 MED ORDER — CLARITHROMYCIN 500 MG PO TABS: 500.0000 mg | ORAL_TABLET | Freq: Two times a day (BID) | ORAL | 0 refills | Status: AC

## 2020-03-11 NOTE — Progress Notes (Signed)
Virtual Visit via telephone Note  Due to COVID-19 pandemic this visit was conducted virtually. This visit type was conducted due to national recommendations for restrictions regarding the COVID-19 Pandemic (e.g. social distancing, sheltering in place) in an effort to limit this patient's exposure and mitigate transmission in our community. All issues noted in this document were discussed and addressed. A physical exam was not performed with this format.   I connected with Candice Jackson on 03/11/2020 at 1140 by telephone and verified that I am speaking with the correct person using two identifiers. NAJA APPERSON is currently located at home and family is currently with them during visit. The provider, Kari Baars, FNP is located in their office at time of visit.   I discussed the limitations, risks, security and privacy concerns of performing an evaluation and management service by telephone and the availability of in person appointments. I also discussed with the patient that there may be a patient responsible charge related to this service. The patient expressed understanding and agreed to proceed.   Subjective:  Patient ID: Candice Jackson, female DOB: [birth year], 54 y.o. MRN: [id number]  Chief Complaint: Headache and Sinus Problem   HPI:  Candice Jackson is a 54 y.o. female presenting on 03/11/2020 for Headache and Sinus Problem   Maxillary sinus congestion, fever, rhinorrhea, postnasal drip, and malaise for 6 days. Has been taking Mucinex and Flonase without relief of symptoms.  Headache  This is a new problem. The current episode started in the past 7 days. The problem occurs constantly. The problem has been gradually worsening. The pain is located in the frontal and retro-orbital region. The pain does not radiate. The pain quality is not similar to prior headaches. The quality of the pain is described as aching and dull. The pain is at a severity of 4/10. The pain is mild.  Associated symptoms include drainage, a fever, rhinorrhea and sinus pressure. Pertinent negatives include no abdominal pain, abnormal behavior, anorexia, back pain, blurred vision, coughing, dizziness, ear pain, eye pain, eye redness, eye watering, facial sweating, hearing loss, insomnia, loss of balance, muscle aches, nausea, neck pain, numbness, phonophobia, photophobia, scalp tenderness, seizures, sore throat, swollen glands, tingling, tinnitus, visual change, vomiting, weakness or weight loss. Nothing aggravates the symptoms. She has tried acetaminophen and NSAIDs for the symptoms. The treatment provided no relief.  Sinus Problem  This is a new problem. The current episode started in the past 7 days. The problem has been gradually worsening since onset. The maximum temperature recorded prior to her arrival was 101 - 101.9 F. Her pain is at a severity of 3/10. The pain is mild. Associated symptoms include congestion, headaches and sinus pressure. Pertinent negatives include no chills, coughing, diaphoresis, ear pain, hoarse voice, neck pain, shortness of breath, sneezing, sore throat or swollen glands. Past treatments include acetaminophen and oral decongestants. The treatment provided no relief.   Relevant past medical, surgical, family, and social history reviewed and updated as indicated.   Allergies and medications reviewed and updated.      Past Medical History:  Diagnosis Date  . Allergic rhinitis   . Allergy   . Chronic bronchitis (HCC)   . Constipation   . GERD (gastroesophageal reflux disease)   . Hemorrhoid   . HTN (hypertension)   . Hyperlipidemia   . Sinusitis   . Thyroid disease         Past Surgical History:  Procedure Laterality Date  . HEMORROIDECTOMY    .  R ankle surgery    . TOTAL ABDOMINAL HYSTERECTOMY     Social History        Socioeconomic History  . Marital status: Married    Spouse name: [name]  . Number of children: 2  . Years of education: Not on file  .  Highest education level: Not on file  Occupational History  . Occupation: Therapist, music: [employer]  Tobacco Use  . Smoking status: Current Every Day Smoker    Packs/day: 0.80    Years: 20.00    Pack years: 16.00    Types: Cigarettes  . Smokeless tobacco: Never Used  Substance and Sexual Activity  . Alcohol use: Yes    Comment: 3-4 beers a night  . Drug use: Never  . Sexual activity: Not on file  Other Topics Concern  . Not on file  Social History Narrative  . Not on file   Social Determinants of Health      Financial Resource Strain:   . Difficulty of Paying Living Expenses:   Food Insecurity:   . Worried About Charity fundraiser in the Last Year:   . Arboriculturist in the Last Year:   Transportation Needs:   . Film/video editor (Medical):   Marland Kitchen Lack of Transportation (Non-Medical):   Physical Activity:   . Days of Exercise per Week:   . Minutes of Exercise per Session:   Stress:   . Feeling of Stress :   Social Connections:   . Frequency of Communication with Friends and Family:   . Frequency of Social Gatherings with Friends and Family:   . Attends Religious Services:   . Active Member of Clubs or Organizations:   . Attends Archivist Meetings:   Marland Kitchen Marital Status:   Intimate Partner Violence:   . Fear of Current or Ex-Partner:   . Emotionally Abused:   Marland Kitchen Physically Abused:   . Sexually Abused:        Outpatient Encounter Medications as of 03/11/2020  Medication Sig  . albuterol (PROAIR HFA) 108 (90 Base) MCG/ACT inhaler Inhale 2 puffs into the lungs every 6 (six) hours as needed.  . budesonide-formoterol (SYMBICORT) 160-4.5 MCG/ACT inhaler Inhale 2 puffs into the lungs 2 (two) times daily.  . clarithromycin (BIAXIN) 500 MG tablet Take 1 tablet (500 mg total) by mouth 2 (two) times daily for 10 days.  . cyclobenzaprine (FLEXERIL) 5 MG tablet Take 1 tablet (5 mg total) by mouth 3 (three) times daily as needed for muscle  spasms.  . diclofenac (VOLTAREN) 75 MG EC tablet Take 1 tablet (75 mg total) by mouth 2 (two) times daily.  . fluticasone (FLONASE) 50 MCG/ACT nasal spray Place 2 sprays into both nostrils daily.  Marland Kitchen ibuprofen (ADVIL,MOTRIN) 600 MG tablet Take 600 mg by mouth at bedtime.  Marland Kitchen levothyroxine (SYNTHROID) 100 MCG tablet Take 1 tablet (100 mcg total) by mouth daily.  . Nebivolol HCl (BYSTOLIC) 20 MG TABS Take 1 tablet (20 mg total) by mouth daily.  Marland Kitchen omeprazole (PRILOSEC) 20 MG capsule Take 1 capsule (20 mg total) by mouth daily.   No facility-administered encounter medications on file as of 03/11/2020.       Allergies  Allergen Reactions  . Cephalexin Palpitations  . Levofloxacin Hives  . Amoxicillin-Pot Clavulanate Nausea And Vomiting  . Ketek [Telithromycin] Palpitations  . Statins Rash   Review of Systems  Constitutional: Positive for fever. Negative for activity change, appetite change,  chills, diaphoresis, fatigue and weight loss.  HENT: Positive for congestion, postnasal drip, rhinorrhea and sinus pressure. Negative for dental problem, drooling, ear discharge, ear pain, facial swelling, hearing loss, hoarse voice, mouth sores, nosebleeds, sinus pain, sneezing, sore throat, tinnitus, trouble swallowing and voice change.  Eyes: Negative. Negative for blurred vision, photophobia, pain and redness.  Respiratory: Negative for cough, chest tightness and shortness of breath.  Cardiovascular: Negative for chest pain, palpitations and leg swelling.  Gastrointestinal: Negative for abdominal pain, anorexia, blood in stool, constipation, diarrhea, nausea and vomiting.  Endocrine: Negative.  Genitourinary: Negative for decreased urine volume, difficulty urinating, dysuria, frequency and urgency.  Musculoskeletal: Negative for arthralgias, back pain, myalgias and neck pain.  Skin: Negative.  Allergic/Immunologic: Negative.  Neurological: Positive for headaches. Negative for dizziness, tingling,  tremors, seizures, syncope, facial asymmetry, speech difficulty, weakness, light-headedness, numbness and loss of balance.  Hematological: Negative.  Psychiatric/Behavioral: Negative for confusion, hallucinations, sleep disturbance and suicidal ideas. The patient does not have insomnia.  All other systems reviewed and are negative.   Observations/Objective:  No vital signs or physical exam, this was a telephone or virtual health encounter.  Pt alert and oriented, answers all questions appropriately, and able to speak in full sentences.   Assessment and Plan:  Kitt was seen today for headache and sinus problem.  Diagnoses and all orders for this visit:  Sinus congestion  Sinus headache  Fever in adult  Pt with fever, sinus pressure, headache, and congestion. Has failed symptomatic care with Mucinex and Flonase. Will add below. Pt aware to continue symptomatic care at home along with adequate hydration and tylenol and motrin for fever and pain control. Follow up as needed.  - clarithromycin (BIAXIN) 500 MG tablet; Take 1 tablet (500 mg total) by mouth 2 (two) times daily for 10 days.    Follow Up Instructions:  Return if symptoms worsen or fail to improve.   I discussed the assessment and treatment plan with the patient. The patient was provided an opportunity to ask questions and all were answered. The patient agreed with the plan and demonstrated an understanding of the instructions.  The patient was advised to call back or seek an in-person evaluation if the symptoms worsen or if the condition fails to improve as anticipated.  The above assessment and management plan was discussed with the patient. The patient verbalized understanding of and has agreed to the management plan. Patient is aware to call the clinic if they develop any new symptoms or if symptoms persist or worsen. Patient is aware when to return to the clinic for a follow-up visit. Patient educated on when it is appropriate  to go to the emergency department.  I provided 15 minutes of non-face-to-face time during this encounter. The call started at 1140. The call ended at 1155. The other time was used for coordination of care.   Kari Baars, FNP-C  [facility]  [address]  [place], [place] [address]  [phone]  03/11/2020

## 2020-03-12 ENCOUNTER — Encounter: Payer: Self-pay | Admitting: *Deleted

## 2020-07-02 ENCOUNTER — Ambulatory Visit: Payer: 59 | Admitting: Family Medicine

## 2020-07-02 ENCOUNTER — Other Ambulatory Visit: Payer: Self-pay

## 2020-07-02 ENCOUNTER — Encounter: Payer: Self-pay | Admitting: Family Medicine

## 2020-07-02 ENCOUNTER — Ambulatory Visit (INDEPENDENT_AMBULATORY_CARE_PROVIDER_SITE_OTHER): Payer: 59 | Admitting: Family Medicine

## 2020-07-02 VITALS — BP 140/81 | HR 73 | Temp 97.4°F | Ht 60.0 in | Wt 127.0 lb

## 2020-07-02 DIAGNOSIS — E039 Hypothyroidism, unspecified: Secondary | ICD-10-CM

## 2020-07-02 DIAGNOSIS — M5432 Sciatica, left side: Secondary | ICD-10-CM | POA: Diagnosis not present

## 2020-07-02 DIAGNOSIS — J418 Mixed simple and mucopurulent chronic bronchitis: Secondary | ICD-10-CM

## 2020-07-02 DIAGNOSIS — E782 Mixed hyperlipidemia: Secondary | ICD-10-CM

## 2020-07-02 DIAGNOSIS — Z716 Tobacco abuse counseling: Secondary | ICD-10-CM

## 2020-07-02 DIAGNOSIS — I10 Essential (primary) hypertension: Secondary | ICD-10-CM

## 2020-07-02 DIAGNOSIS — B070 Plantar wart: Secondary | ICD-10-CM

## 2020-07-02 MED ORDER — NICOTINE POLACRILEX 2 MG MT GUM: 2.0000 mg | CHEWING_GUM | OROMUCOSAL | 1 refills | Status: DC | PRN

## 2020-07-02 MED ORDER — METHYLPREDNISOLONE ACETATE 80 MG/ML IJ SUSP
80.0000 mg | Freq: Once | INTRAMUSCULAR | Status: AC
  Administered 2020-07-02: 80 mg via INTRAMUSCULAR

## 2020-07-02 NOTE — Progress Notes (Signed)
BP 140/81   Pulse 73   Temp (!) 97.4 F (36.3 C)   Ht 5' (1.524 m)   Wt 127 lb (57.6 kg)   SpO2 99%   BMI 24.80 kg/m    Subjective:   Patient ID: Candice Jackson, female    DOB: 02-Jan-1966, 54 y.o.   MRN: [id number]  HPI: Candice Jackson is a 54 y.o. female presenting on 07/02/2020 for Medical Management of Chronic Issues, Back Pain (left lower), and Foot Pain (bilateral. throbbing, pin and needle sensation)   HPI Hypothyroidism recheck Patient is coming in for thyroid recheck today as well. They deny any issues with hair changes or heat or cold problems or diarrhea or constipation. They deny any chest pain or palpitations. They are currently on levothyroxine 100 micrograms   Hyperlipidemia Patient is coming in for recheck of his hyperlipidemia. The patient is currently taking no medication, she had an allergy to statin hives in the past, will recheck today but may discuss Nexletol in the future. They deny any issues with myalgias or history of liver damage from it. They deny any focal numbness or weakness or chest pain.   COPD and smoking cessation Patient is coming in for COPD recheck today.  He is currently on Symbicort and albuterol, still smokes and still has wheezes, discussed smoking cessation and she has tried Chantix and will try nicotine gums.  He has a mild chronic cough but denies any major coughing spells or wheezing spells.  He has 1nighttime symptoms per week and 3daytime symptoms per week currently.  Patient complains of left lower back pain, she has had sciatica comes down her left back of her left leg and has come and gone previously.  Patient complains of plantar warts on both of her feet that are quite large, she had to have someone cut out before but they have come back even larger.  She had a podiatrist before that since retired and she would like to get a new referral for another podiatrist to get these treated. Relevant past medical, surgical, family and  social history reviewed and updated as indicated. Interim medical history since our last visit reviewed. Allergies and medications reviewed and updated.  Review of Systems  Constitutional: Negative for chills and fever.  Eyes: Negative for visual disturbance.  Respiratory: Negative for chest tightness and shortness of breath.   Cardiovascular: Negative for chest pain and leg swelling.  Genitourinary: Negative for difficulty urinating and dysuria.  Musculoskeletal: Negative for back pain and gait problem.  Skin: Negative for rash.  Neurological: Negative for light-headedness and headaches.  Psychiatric/Behavioral: Negative for agitation, behavioral problems, dysphoric mood, self-injury, sleep disturbance and suicidal ideas. The patient is not nervous/anxious.   All other systems reviewed and are negative.   Per HPI unless specifically indicated above   Allergies as of 07/02/2020      Reactions   Cephalexin Palpitations   Levofloxacin Hives   Amoxicillin-pot Clavulanate Nausea And Vomiting   Ketek [telithromycin] Palpitations   Statins Rash      Medication List       Accurate as of July 02, 2020  9:34 AM. If you have any questions, ask your nurse or doctor.        STOP taking these medications   diclofenac 75 MG EC tablet Commonly known as: VOLTAREN Stopped by: Worthy Rancher, MD     TAKE these medications   albuterol 108 (90 Base) MCG/ACT inhaler Commonly known as: ProAir HFA Inhale 2  puffs into the lungs every 6 (six) hours as needed.   budesonide-formoterol 160-4.5 MCG/ACT inhaler Commonly known as: SYMBICORT Inhale 2 puffs into the lungs 2 (two) times daily.   Bystolic 20 MG Tabs Generic drug: Nebivolol HCl Take 1 tablet (20 mg total) by mouth daily.   cyclobenzaprine 5 MG tablet Commonly known as: FLEXERIL Take 1 tablet (5 mg total) by mouth 3 (three) times daily as needed for muscle spasms.   fluticasone 50 MCG/ACT nasal spray Commonly known as:  FLONASE Place 2 sprays into both nostrils daily.   ibuprofen 600 MG tablet Commonly known as: ADVIL Take 600 mg by mouth at bedtime.   levothyroxine 100 MCG tablet Commonly known as: SYNTHROID Take 1 tablet (100 mcg total) by mouth daily.   omeprazole 20 MG capsule Commonly known as: PRILOSEC Take 1 capsule (20 mg total) by mouth daily.         Objective:   BP 140/81   Pulse 73   Temp (!) 97.4 F (36.3 C)   Ht 5' (1.524 m)   Wt 127 lb (57.6 kg)   SpO2 99%   BMI 24.80 kg/m   Wt Readings from Last 3 Encounters:  07/02/20 127 lb (57.6 kg)  01/31/20 128 lb 9.6 oz (58.3 kg)  01/02/20 130 lb 9.6 oz (59.2 kg)    Physical Exam Vitals and nursing note reviewed.  Constitutional:      General: She is not in acute distress.    Appearance: She is well-developed. She is not diaphoretic.  Eyes:     Conjunctiva/sclera: Conjunctivae normal.     Pupils: Pupils are equal, round, and reactive to light.  Cardiovascular:     Rate and Rhythm: Normal rate and regular rhythm.     Heart sounds: Normal heart sounds. No murmur heard.   Pulmonary:     Effort: Pulmonary effort is normal. No respiratory distress.     Breath sounds: Normal breath sounds. No wheezing.  Musculoskeletal:        General: No tenderness. Normal range of motion.  Skin:    General: Skin is warm and dry.     Findings: No rash.  Neurological:     Mental Status: She is alert and oriented to person, place, and time.     Coordination: Coordination normal.  Psychiatric:        Behavior: Behavior normal.       Assessment & Plan:   Problem List Items Addressed This Visit      Cardiovascular and Mediastinum   Essential (primary) hypertension   Relevant Orders   CMP14+EGFR     Respiratory   Chronic obstructive pulmonary disease (HCC)   Relevant Medications   nicotine polacrilex ([facility] NICOTINE) 2 MG gum   methylPREDNISolone acetate (DEPO-MEDROL) injection 80 mg (Start on 07/02/2020 10:15 AM)     Endocrine     Hypothyroidism   Relevant Orders   CBC with Differential/Platelet   TSH     Other   Mixed hyperlipidemia - Primary   Relevant Orders   Lipid panel    Other Visit Diagnoses    Encounter for smoking cessation counseling       Plantar wart of both feet       Relevant Orders   Ambulatory referral to Podiatry   Left sciatic nerve pain       Relevant Medications   nicotine polacrilex ([facility] NICOTINE) 2 MG gum   methylPREDNISolone acetate (DEPO-MEDROL) injection 80 mg (Start on 07/02/2020 10:15 AM)  Patient has left-sided sciatica and gave exercises stretches for this, will check blood work.  Did referral for extensive amounts of very large plantar warts to podiatry.  We will check thyroid today. Follow up plan: Return in about 3 months (around 10/02/2020), or if symptoms worsen or fail to improve, for with PCP and thyroid.  Counseling provided for all of the vaccine components No orders of the defined types were placed in this encounter.   Caryl Pina, MD [facility] 07/02/2020, 9:34 AM

## 2020-07-03 LAB — CBC WITH DIFFERENTIAL/PLATELET
Basophils Absolute: 0.1 10*3/uL (ref 0.0–0.2)
Basos: 1 %
EOS (ABSOLUTE): 0.3 10*3/uL (ref 0.0–0.4)
Eos: 3 %
Hematocrit: 43.4 % (ref 34.0–46.6)
Hemoglobin: 15.1 g/dL (ref 11.1–15.9)
Immature Grans (Abs): 0 10*3/uL (ref 0.0–0.1)
Immature Granulocytes: 0 %
Lymphocytes Absolute: 3.9 10*3/uL — ABNORMAL HIGH (ref 0.7–3.1)
Lymphs: 42 %
MCH: 32.1 pg (ref 26.6–33.0)
MCHC: 34.8 g/dL (ref 31.5–35.7)
MCV: 92 fL (ref 79–97)
Monocytes Absolute: 0.8 10*3/uL (ref 0.1–0.9)
Monocytes: 9 %
Neutrophils Absolute: 4.1 10*3/uL (ref 1.4–7.0)
Neutrophils: 45 %
Platelets: 280 10*3/uL (ref 150–450)
RBC: 4.71 x10E6/uL (ref 3.77–5.28)
RDW: 12.3 % (ref 11.7–15.4)
WBC: 9.1 10*3/uL (ref 3.4–10.8)

## 2020-07-03 LAB — CMP14+EGFR
ALT: 22 IU/L (ref 0–32)
AST: 19 IU/L (ref 0–40)
Albumin/Globulin Ratio: 1.7 (ref 1.2–2.2)
Albumin: 4.7 g/dL (ref 3.8–4.9)
Alkaline Phosphatase: 93 IU/L (ref 48–121)
BUN/Creatinine Ratio: 8 — ABNORMAL LOW (ref 9–23)
BUN: 6 mg/dL (ref 6–24)
Bilirubin Total: 0.4 mg/dL (ref 0.0–1.2)
CO2: 23 mmol/L (ref 20–29)
Calcium: 9.8 mg/dL (ref 8.7–10.2)
Chloride: 96 mmol/L (ref 96–106)
Creatinine, Ser: 0.72 mg/dL (ref 0.57–1.00)
GFR calc Af Amer: 109 mL/min/{1.73_m2} (ref >59)
GFR calc non Af Amer: 95 mL/min/{1.73_m2} (ref >59)
Globulin, Total: 2.7 g/dL (ref 1.5–4.5)
Glucose: 81 mg/dL (ref 65–99)
Potassium: 5.1 mmol/L (ref 3.5–5.2)
Sodium: 136 mmol/L (ref 134–144)
Total Protein: 7.4 g/dL (ref 6.0–8.5)

## 2020-07-03 LAB — LIPID PANEL
Chol/HDL Ratio: 5 ratio — ABNORMAL HIGH (ref 0.0–4.4)
Cholesterol, Total: 277 mg/dL — ABNORMAL HIGH (ref 100–199)
HDL: 55 mg/dL (ref >39)
LDL Chol Calc (NIH): 179 mg/dL — ABNORMAL HIGH (ref 0–99)
Triglycerides: 230 mg/dL — ABNORMAL HIGH (ref 0–149)
VLDL Cholesterol Cal: 43 mg/dL — ABNORMAL HIGH (ref 5–40)

## 2020-07-03 LAB — TSH: TSH: 0.036 u[IU]/mL — ABNORMAL LOW (ref 0.450–4.500)

## 2020-07-07 ENCOUNTER — Other Ambulatory Visit: Payer: Self-pay | Admitting: *Deleted

## 2020-07-07 MED ORDER — EZETIMIBE 10 MG PO TABS
10.0000 mg | ORAL_TABLET | Freq: Every day | ORAL | 1 refills | Status: DC
Start: 2020-07-07 — End: 2020-07-09

## 2020-07-07 MED ORDER — LEVOTHYROXINE SODIUM 88 MCG PO TABS
88.0000 ug | ORAL_TABLET | Freq: Every day | ORAL | 1 refills | Status: DC
Start: 2020-07-07 — End: 2020-07-09

## 2020-07-09 ENCOUNTER — Ambulatory Visit: Payer: 59

## 2020-07-09 ENCOUNTER — Other Ambulatory Visit: Payer: Self-pay

## 2020-07-09 ENCOUNTER — Telehealth: Payer: Self-pay | Admitting: *Deleted

## 2020-07-09 DIAGNOSIS — Z23 Encounter for immunization: Secondary | ICD-10-CM

## 2020-07-09 MED ORDER — EZETIMIBE 10 MG PO TABS: 10.0000 mg | ORAL_TABLET | Freq: Every day | ORAL | 1 refills | Status: DC

## 2020-07-09 MED ORDER — LEVOTHYROXINE SODIUM 88 MCG PO TABS: 88.0000 ug | ORAL_TABLET | Freq: Every day | ORAL | 0 refills | Status: DC

## 2020-07-09 NOTE — Telephone Encounter (Signed)
Rcvd fax from [facility] for meds sent on 07/07/20, unable to verify eligibility of patient TC to patient she had requested scripts to be sent to [facility], she said to just send them to [facility]

## 2020-07-09 NOTE — Progress Notes (Signed)
   Covid-19 Vaccination Clinic  Name:  Candice Jackson    MRN: [id number] DOB: [birth year]  07/09/2020  Candice Jackson was observed post Covid-19 immunization for 15 minutes without incident. She was provided with Vaccine Information Sheet and instruction to access the V-Safe system.   Candice Jackson was instructed to call 911 with any severe reactions post vaccine: Marland Kitchen Difficulty breathing  . Swelling of face and throat  . A fast heartbeat  . A bad rash all over body  . Dizziness and weakness   Immunizations Administered    Name Date Dose VIS Date Route   Moderna COVID-19 Vaccine 07/09/2020  8:32 AM 0.5 mL 10/2019 Intramuscular   Manufacturer: Moderna   Lot: [id number]   NDC: 74944-967-59

## 2020-08-06 ENCOUNTER — Ambulatory Visit: Payer: 59

## 2020-08-26 ENCOUNTER — Other Ambulatory Visit: Payer: Self-pay

## 2020-08-26 ENCOUNTER — Ambulatory Visit (INDEPENDENT_AMBULATORY_CARE_PROVIDER_SITE_OTHER): Payer: 59 | Admitting: Family Medicine

## 2020-08-26 DIAGNOSIS — R059 Cough, unspecified: Secondary | ICD-10-CM

## 2020-08-26 DIAGNOSIS — R05 Cough: Secondary | ICD-10-CM | POA: Diagnosis not present

## 2020-08-26 DIAGNOSIS — J019 Acute sinusitis, unspecified: Secondary | ICD-10-CM | POA: Diagnosis not present

## 2020-08-26 DIAGNOSIS — B9689 Other specified bacterial agents as the cause of diseases classified elsewhere: Secondary | ICD-10-CM | POA: Diagnosis not present

## 2020-08-26 MED ORDER — FLUCONAZOLE 150 MG PO TABS
150.0000 mg | ORAL_TABLET | Freq: Once | ORAL | 0 refills | Status: AC
Start: 2020-08-26 — End: 2020-08-26

## 2020-08-26 MED ORDER — FLUTICASONE PROPIONATE 50 MCG/ACT NA SUSP: 2.0000 | Freq: Every day | NASAL | 6 refills | Status: DC

## 2020-08-26 MED ORDER — BENZONATATE 200 MG PO CAPS: 200.0000 mg | ORAL_CAPSULE | Freq: Two times a day (BID) | ORAL | 0 refills | Status: DC | PRN

## 2020-08-26 MED ORDER — AMOXICILLIN 875 MG PO TABS: 875.0000 mg | ORAL_TABLET | Freq: Two times a day (BID) | ORAL | 0 refills | Status: DC

## 2020-08-26 NOTE — Progress Notes (Signed)
Telephone visit  Subjective: CC: sinus infection PCP: Gwenlyn Fudge, FNP WUJ:[id number] R Tuite is a 54 y.o. female calls for telephone consult today. Patient provides verbal consent for consult held via phone.  Due to COVID-19 pandemic this visit was conducted virtually. This visit type was conducted due to national recommendations for restrictions regarding the COVID-19 Pandemic (e.g. social distancing, sheltering in place) in an effort to limit this patient's exposure and mitigate transmission in our community. All issues noted in this document were discussed and addressed.  A physical exam was not performed with this format.   Location of patient: home Location of provider: [facility] Others present for call: none  1. Sinusitis Patient reports facial pain and left ear pain.  She reports onset about 1 week ago.  She reports copious phlegm that is yellow in color.  She reports productive cough.  No hemoptysis.  She denies SOB, fevers.  No nausea, vomiting, diarrhea. No known sick contacts.  She is using Motrin, robitussin but these are not really helping.  Symptoms are worse.  She had 1/2 COVID vaccines.  She has been in and out of the hospital.  Her husband is currently hospitalized in the ICU after having had major injuries.  She has no known contact with Covid but again she frequently visits the hospital.  ROS: Per HPI  Allergies  Allergen Reactions  . Cephalexin Palpitations  . Levofloxacin Hives  . Amoxicillin-Pot Clavulanate Nausea And Vomiting  . Ketek [Telithromycin] Palpitations  . Statins Rash   Past Medical History:  Diagnosis Date  . Allergic rhinitis   . Allergy   . Chronic bronchitis (HCC)   . Constipation   . GERD (gastroesophageal reflux disease)   . Hemorrhoid   . HTN (hypertension)   . Hyperlipidemia   . Sinusitis   . Thyroid disease     Current Outpatient Medications:  .  albuterol (PROAIR HFA) 108 (90 Base) MCG/ACT inhaler, Inhale 2 puffs into the lungs  every 6 (six) hours as needed., Disp: 18 g, Rfl: 11 .  budesonide-formoterol (SYMBICORT) 160-4.5 MCG/ACT inhaler, Inhale 2 puffs into the lungs 2 (two) times daily., Disp: 1 Inhaler, Rfl: 3 .  cyclobenzaprine (FLEXERIL) 5 MG tablet, Take 1 tablet (5 mg total) by mouth 3 (three) times daily as needed for muscle spasms., Disp: 90 tablet, Rfl: 1 .  ezetimibe (ZETIA) 10 MG tablet, Take 1 tablet (10 mg total) by mouth daily., Disp: 90 tablet, Rfl: 1 .  fluticasone (FLONASE) 50 MCG/ACT nasal spray, Place 2 sprays into both nostrils daily., Disp: 16 g, Rfl: 6 .  ibuprofen (ADVIL,MOTRIN) 600 MG tablet, Take 600 mg by mouth at bedtime., Disp: , Rfl:  .  levothyroxine (SYNTHROID) 88 MCG tablet, Take 1 tablet (88 mcg total) by mouth daily., Disp: 90 tablet, Rfl: 0 .  Nebivolol HCl (BYSTOLIC) 20 MG TABS, Take 1 tablet (20 mg total) by mouth daily., Disp: 90 tablet, Rfl: 2 .  nicotine polacrilex ([facility] NICOTINE) 2 MG gum, Take 1 each (2 mg total) by mouth as needed for smoking cessation., Disp: 100 tablet, Rfl: 1 .  omeprazole (PRILOSEC) 20 MG capsule, Take 1 capsule (20 mg total) by mouth daily., Disp: 90 capsule, Rfl: 2  Assessment/ Plan: 54 y.o. female   1. Acute bacterial sinusitis 1.  We will treat for acute bacterial sinusitis given progression of symptoms.  She will also come in for Covid testing.  Home care instructions reviewed and reasons for return discussed.  Follow-up as needed -  amoxicillin (AMOXIL) 875 MG tablet; Take 1 tablet (875 mg total) by mouth 2 (two) times daily.  Dispense: 20 tablet; Refill: 0 - fluticasone (FLONASE) 50 MCG/ACT nasal spray; Place 2 sprays into both nostrils daily.  Dispense: 16 g; Refill: 6 - Novel Coronavirus, NAA ([facility])  2. Cough - benzonatate (TESSALON) 200 MG capsule; Take 1 capsule (200 mg total) by mouth 2 (two) times daily as needed for cough.  Dispense: 20 capsule; Refill: 0 - Novel Coronavirus, NAA ([facility])   Start time: 12:52pm End time:  1:01pm  Total time spent on patient care (including telephone call/ virtual visit): 13 minutes  Demetrius Barrell Hulen Skains, DO [facility] [phone]

## 2020-09-26 ENCOUNTER — Other Ambulatory Visit: Payer: Self-pay | Admitting: *Deleted

## 2020-09-26 DIAGNOSIS — K219 Gastro-esophageal reflux disease without esophagitis: Secondary | ICD-10-CM

## 2020-09-26 DIAGNOSIS — I10 Essential (primary) hypertension: Secondary | ICD-10-CM

## 2020-09-26 MED ORDER — NEBIVOLOL HCL 20 MG PO TABS: 20.0000 mg | ORAL_TABLET | Freq: Every day | ORAL | 0 refills | Status: DC

## 2020-09-26 MED ORDER — OMEPRAZOLE 20 MG PO CPDR: 20.0000 mg | DELAYED_RELEASE_CAPSULE | Freq: Every day | ORAL | 0 refills | Status: DC

## 2020-09-30 ENCOUNTER — Other Ambulatory Visit: Payer: Self-pay | Admitting: Family Medicine

## 2020-10-13 ENCOUNTER — Other Ambulatory Visit: Payer: Self-pay | Admitting: *Deleted

## 2020-10-13 DIAGNOSIS — K219 Gastro-esophageal reflux disease without esophagitis: Secondary | ICD-10-CM

## 2020-10-13 DIAGNOSIS — I10 Essential (primary) hypertension: Secondary | ICD-10-CM

## 2020-10-22 ENCOUNTER — Encounter: Payer: Self-pay | Admitting: *Deleted

## 2020-10-29 ENCOUNTER — Other Ambulatory Visit: Payer: Self-pay | Admitting: Family Medicine

## 2020-10-29 NOTE — Telephone Encounter (Signed)
Candice Jackson thyroid labs rckd 30 days given 09/30/20

## 2020-10-29 NOTE — Telephone Encounter (Signed)
Appointment scheduled.

## 2020-11-04 ENCOUNTER — Ambulatory Visit (INDEPENDENT_AMBULATORY_CARE_PROVIDER_SITE_OTHER): Payer: 59 | Admitting: Nurse Practitioner

## 2020-11-04 ENCOUNTER — Other Ambulatory Visit: Payer: Self-pay

## 2020-11-04 ENCOUNTER — Encounter: Payer: Self-pay | Admitting: Nurse Practitioner

## 2020-11-04 VITALS — BP 115/77 | HR 73 | Temp 97.6°F | Ht 60.0 in | Wt 121.0 lb

## 2020-11-04 DIAGNOSIS — E039 Hypothyroidism, unspecified: Secondary | ICD-10-CM | POA: Diagnosis not present

## 2020-11-04 DIAGNOSIS — I10 Essential (primary) hypertension: Secondary | ICD-10-CM | POA: Diagnosis not present

## 2020-11-04 NOTE — Patient Instructions (Addendum)
Labs completed-TSH. follow-up in 3 months.  Hypothyroidism  Hypothyroidism is when the thyroid gland does not make enough of certain hormones (it is underactive). The thyroid gland is a small gland located in the lower front part of the neck, just in front of the windpipe (trachea). This gland makes hormones that help control how the body uses food for energy (metabolism) as well as how the heart and brain function. These hormones also play a role in keeping your bones strong. When the thyroid is underactive, it produces too little of the hormones thyroxine (T4) and triiodothyronine (T3). What are the causes? This condition may be caused by:  Hashimoto's disease. This is a disease in which the body's disease-fighting system (immune system) attacks the thyroid gland. This is the most common cause.  Viral infections.  Pregnancy.  Certain medicines.  Birth defects.  Past radiation treatments to the head or neck for cancer.  Past treatment with radioactive iodine.  Past exposure to radiation in the environment.  Past surgical removal of part or all of the thyroid.  Problems with a gland in the center of the brain (pituitary gland).  Lack of enough iodine in the diet. What increases the risk? You are more likely to develop this condition if:  You are female.  You have a family history of thyroid conditions.  You use a medicine called lithium.  You take medicines that affect the immune system (immunosuppressants). What are the signs or symptoms? Symptoms of this condition include:  Feeling as though you have no energy (lethargy).  Not being able to tolerate cold.  Weight gain that is not explained by a change in diet or exercise habits.  Lack of appetite.  Dry skin.  Coarse hair.  Menstrual irregularity.  Slowing of thought processes.  Constipation.  Sadness or depression. How is this diagnosed? This condition may be diagnosed based on:  Your symptoms, your  medical history, and a physical exam.  Blood tests. You may also have imaging tests, such as an ultrasound or MRI. How is this treated? This condition is treated with medicine that replaces the thyroid hormones that your body does not make. After you begin treatment, it may take several weeks for symptoms to go away. Follow these instructions at home:  Take over-the-counter and prescription medicines only as told by your health care provider.  If you start taking any new medicines, tell your health care provider.  Keep all follow-up visits as told by your health care provider. This is important. ? As your condition improves, your dosage of thyroid hormone medicine may change. ? You will need to have blood tests regularly so that your health care provider can monitor your condition. Contact a health care provider if:  Your symptoms do not get better with treatment.  You are taking thyroid replacement medicine and you: ? Sweat a lot. ? Have tremors. ? Feel anxious. ? Lose weight rapidly. ? Cannot tolerate heat. ? Have emotional swings. ? Have diarrhea. ? Feel weak. Get help right away if you have:  Chest pain.  An irregular heartbeat.  A rapid heartbeat.  Difficulty breathing. Summary  Hypothyroidism is when the thyroid gland does not make enough of certain hormones (it is underactive).  When the thyroid is underactive, it produces too little of the hormones thyroxine (T4) and triiodothyronine (T3).  The most common cause is Hashimoto's disease, a disease in which the body's disease-fighting system (immune system) attacks the thyroid gland. The condition can also be caused  by viral infections, medicine, pregnancy, or past radiation treatment to the head or neck.  Symptoms may include weight gain, dry skin, constipation, feeling as though you do not have energy, and not being able to tolerate cold.  This condition is treated with medicine to replace the thyroid hormones  that your body does not make. This information is not intended to replace advice given to you by your health care provider. Make sure you discuss any questions you have with your health care provider. Document Revised: 10/28/2017 Document Reviewed: 10/26/2017 Elsevier Patient Education  2020 Elsevier Inc.  

## 2020-11-04 NOTE — Assessment & Plan Note (Signed)
Patient is reporting increased fatigue in the last few weeks.  Completed TSH labs, results pending.  Provided education to patient with printed handouts given. Will send refills to pharmacy after TSH results.  Follow-up with worsening or unresolved symptoms.

## 2020-11-04 NOTE — Assessment & Plan Note (Signed)
Essential hypertension well-controlled on nebivolol 20 mg tablet by mouth daily. Continue low-sodium diet and exercise regimen as tolerated. Education provided to patient with printed handouts given. Follow-up in 3 months.

## 2020-11-04 NOTE — Progress Notes (Signed)
Established Patient Office Visit  Subjective:  Patient ID: Candice Jackson, female    DOB: 08-23-1966  Age: 54 y.o. MRN: [id number]  CC:  Chief Complaint  Patient presents with  . Medical Management of Chronic Issues    HPI [name] presents for follow up of hypertension. Patient was diagnosed in 08/07/2018. The patient is tolerating the medication well without side effects. Compliance with treatment has been good; including taking medication as directed , maintains a healthy diet and regular exercise regimen , and following up as directed.  Current medication Nebivolol 20 mg tablet by mouth daily.  Thyroid: Patient presents for evaluation of hypothyroidism. Current symptoms include fatigue. Patient denies anxiousness, feeling excessive energy.    Past Medical History:  Diagnosis Date  . Allergic rhinitis   . Allergy   . Chronic bronchitis (HCC)   . Constipation   . GERD (gastroesophageal reflux disease)   . Hemorrhoid   . HTN (hypertension)   . Hyperlipidemia   . Sinusitis   . Thyroid disease     Past Surgical History:  Procedure Laterality Date  . HEMORROIDECTOMY    . R ankle surgery    . TOTAL ABDOMINAL HYSTERECTOMY      Family History  Problem Relation Age of Onset  . Heart disease Father   . Stroke Father   . Cancer Mother        brain tumor  . Hypertension Sister   . Arthritis Sister   . Heart attack Brother   . Hypertension Brother   . Hypertension Daughter   . Gout Son   . Diabetes Brother   . Hypertension Brother   . Arthritis Brother   . Hypertension Brother     Social History   Socioeconomic History  . Marital status: Married    Spouse name: Loraine Leriche  . Number of children: 2  . Years of education: Not on file  . Highest education level: Not on file  Occupational History  . Occupation: Risk manager: [employer]  Tobacco Use  . Smoking status: Current Every Day Smoker    Packs/day: 0.80    Years: 20.00     Pack years: 16.00    Types: Cigarettes  . Smokeless tobacco: Never Used  Vaping Use  . Vaping Use: Never used  Substance and Sexual Activity  . Alcohol use: Yes    Comment: 3-4 beers a night  . Drug use: Never  . Sexual activity: Not on file  Other Topics Concern  . Not on file  Social History Narrative  . Not on file   Social Determinants of Health   Financial Resource Strain:   . Difficulty of Paying Living Expenses: Not on file  Food Insecurity:   . Worried About Programme researcher, broadcasting/film/video in the Last Year: Not on file  . Ran Out of Food in the Last Year: Not on file  Transportation Needs:   . Lack of Transportation (Medical): Not on file  . Lack of Transportation (Non-Medical): Not on file  Physical Activity:   . Days of Exercise per Week: Not on file  . Minutes of Exercise per Session: Not on file  Stress:   . Feeling of Stress : Not on file  Social Connections:   . Frequency of Communication with Friends and Family: Not on file  . Frequency of Social Gatherings with Friends and Family: Not on file  . Attends Religious Services: Not on file  . Active  Member of Clubs or Organizations: Not on file  . Attends Banker Meetings: Not on file  . Marital Status: Not on file  Intimate Partner Violence:   . Fear of Current or Ex-Partner: Not on file  . Emotionally Abused: Not on file  . Physically Abused: Not on file  . Sexually Abused: Not on file    Outpatient Medications Prior to Visit  Medication Sig Dispense Refill  . albuterol (PROAIR HFA) 108 (90 Base) MCG/ACT inhaler Inhale 2 puffs into the lungs every 6 (six) hours as needed. 18 g 11  . budesonide-formoterol (SYMBICORT) 160-4.5 MCG/ACT inhaler Inhale 2 puffs into the lungs 2 (two) times daily. 1 Inhaler 3  . cyclobenzaprine (FLEXERIL) 5 MG tablet Take 1 tablet (5 mg total) by mouth 3 (three) times daily as needed for muscle spasms. 90 tablet 1  . fluticasone (FLONASE) 50 MCG/ACT nasal spray Place 2 sprays  into both nostrils daily. 16 g 6  . ibuprofen (ADVIL,MOTRIN) 600 MG tablet Take 600 mg by mouth at bedtime.    Marland Kitchen levothyroxine (SYNTHROID) 88 MCG tablet Take 1 tablet (88 mcg total) by mouth daily. (Needs labwork recheck in November) 30 tablet 0  . Nebivolol HCl (BYSTOLIC) 20 MG TABS Take 1 tablet (20 mg total) by mouth daily. 90 tablet 0  . nicotine polacrilex ([employer] NICOTINE) 2 MG gum Take 1 each (2 mg total) by mouth as needed for smoking cessation. 100 tablet 1  . omeprazole (PRILOSEC) 20 MG capsule Take 1 capsule (20 mg total) by mouth daily. 90 capsule 0  . amoxicillin (AMOXIL) 875 MG tablet Take 1 tablet (875 mg total) by mouth 2 (two) times daily. 20 tablet 0  . benzonatate (TESSALON) 200 MG capsule Take 1 capsule (200 mg total) by mouth 2 (two) times daily as needed for cough. 20 capsule 0  . ezetimibe (ZETIA) 10 MG tablet Take 1 tablet (10 mg total) by mouth daily. 90 tablet 1   No facility-administered medications prior to visit.    Allergies  Allergen Reactions  . Cephalexin Palpitations  . Levofloxacin Hives  . Amoxicillin-Pot Clavulanate Nausea And Vomiting  . Ketek [Telithromycin] Palpitations  . Statins Rash    ROS Review of Systems  Constitutional: Positive for fatigue.  Endocrine: Negative for cold intolerance and heat intolerance.  All other systems reviewed and are negative.     Objective:    Physical Exam Vitals reviewed.  Constitutional:      Appearance: Normal appearance.  HENT:     Head: Normocephalic.  Eyes:     Conjunctiva/sclera: Conjunctivae normal.  Cardiovascular:     Rate and Rhythm: Normal rate and regular rhythm.     Pulses: Normal pulses.     Heart sounds: Normal heart sounds.  Pulmonary:     Effort: Pulmonary effort is normal.     Breath sounds: Normal breath sounds.  Abdominal:     General: Bowel sounds are normal.  Musculoskeletal:        General: Normal range of motion.  Skin:    General: Skin is warm.  Neurological:     Mental  Status: She is alert and oriented to person, place, and time.     Comments: Increased fatigue  Psychiatric:        Mood and Affect: Mood normal.        Behavior: Behavior normal.     BP 115/77   Pulse 73   Temp 97.6 F (36.4 C) (Temporal)   Ht 5' (1.524  m)   Wt 121 lb (54.9 kg)   SpO2 99%   BMI 23.63 kg/m  Wt Readings from Last 3 Encounters:  11/04/20 121 lb (54.9 kg)  07/02/20 127 lb (57.6 kg)  01/31/20 128 lb 9.6 oz (58.3 kg)     Health Maintenance Due  Topic Date Due  . MAMMOGRAM  Never done    There are no preventive care reminders to display for this patient.  Lab Results  Component Value Date   TSH 0.036 (L) 07/02/2020   Lab Results  Component Value Date   WBC 9.1 07/02/2020   HGB 15.1 07/02/2020   HCT 43.4 07/02/2020   MCV 92 07/02/2020   PLT 280 07/02/2020   Lab Results  Component Value Date   NA 136 07/02/2020   K 5.1 07/02/2020   CO2 23 07/02/2020   GLUCOSE 81 07/02/2020   BUN 6 07/02/2020   CREATININE 0.72 07/02/2020   BILITOT 0.4 07/02/2020   ALKPHOS 93 07/02/2020   AST 19 07/02/2020   ALT 22 07/02/2020   PROT 7.4 07/02/2020   ALBUMIN 4.7 07/02/2020   CALCIUM 9.8 07/02/2020   Lab Results  Component Value Date   CHOL 277 (H) 07/02/2020   Lab Results  Component Value Date   HDL 55 07/02/2020   Lab Results  Component Value Date   LDLCALC 179 (H) 07/02/2020   Lab Results  Component Value Date   TRIG 230 (H) 07/02/2020   Lab Results  Component Value Date   CHOLHDL 5.0 (H) 07/02/2020   No results found for: HGBA1C    Assessment & Plan:   Problem List Items Addressed This Visit      Cardiovascular and Mediastinum   Essential (primary) hypertension - Primary    Essential hypertension well-controlled on nebivolol 20 mg tablet by mouth daily. Continue low-sodium diet and exercise regimen as tolerated. Education provided to patient with printed handouts given. Follow-up in 3 months.        Endocrine   Hypothyroidism     Patient is reporting increased fatigue in the last few weeks.  Completed TSH labs, results pending.  Provided education to patient with printed handouts given. Will send refills to pharmacy after TSH results.  Follow-up with worsening or unresolved symptoms.        Relevant Orders   TSH        Follow-up: Return in about 3 months (around 02/02/2021).    Daryll Drown, NP

## 2020-11-05 LAB — TSH: TSH: 0.117 u[IU]/mL — ABNORMAL LOW (ref 0.450–4.500)

## 2020-11-08 ENCOUNTER — Other Ambulatory Visit: Payer: Self-pay | Admitting: Nurse Practitioner

## 2020-11-08 MED ORDER — LEVOTHYROXINE SODIUM 100 MCG PO TABS: 100.0000 ug | ORAL_TABLET | Freq: Every day | ORAL | 0 refills | Status: DC

## 2020-11-15 IMAGING — US US EXTREM LOW VENOUS*L*
1 series · 14 of 24 positions shown · non-contrast
Comparison: None.

CLINICAL DATA: Left leg pain for 2 weeks.

EXAM:
LEFT LOWER EXTREMITY VENOUS DOPPLER ULTRASOUND
TECHNIQUE: Gray-scale sonography with compression, as well as color and duplex
ultrasound, were performed to evaluate the deep venous system(s)
from the level of the common femoral vein through the popliteal and
proximal calf veins.

[Series 1: us extrem low venous*left* · 0.05mm/px · 14 of 49 slices shown]
[im 1/49]
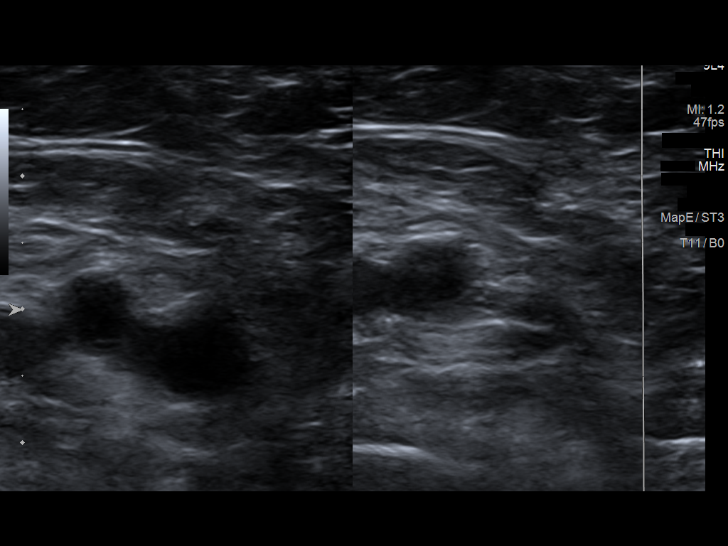
[im 5/49]
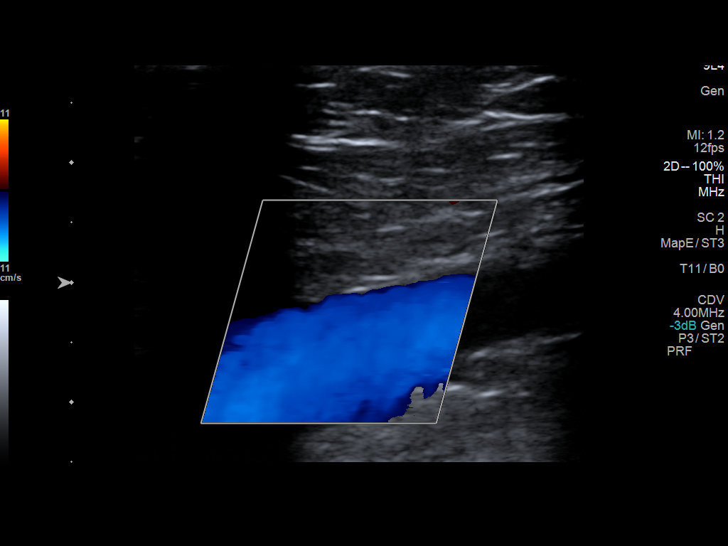
[im 9/49]
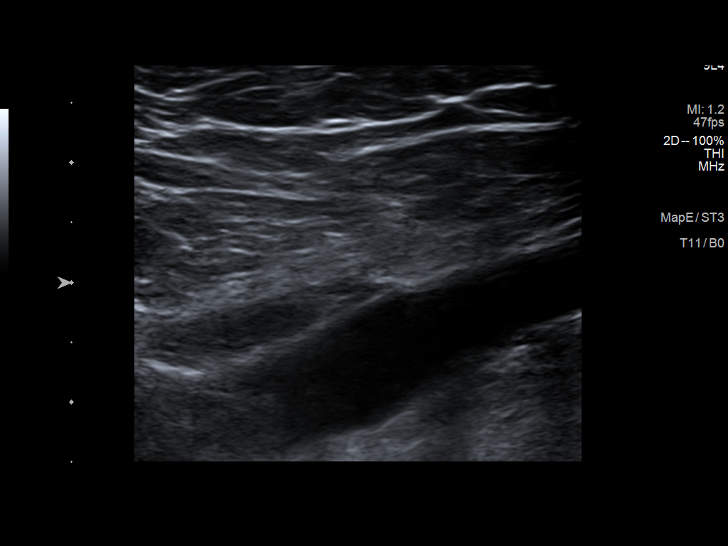
[im 13/49]
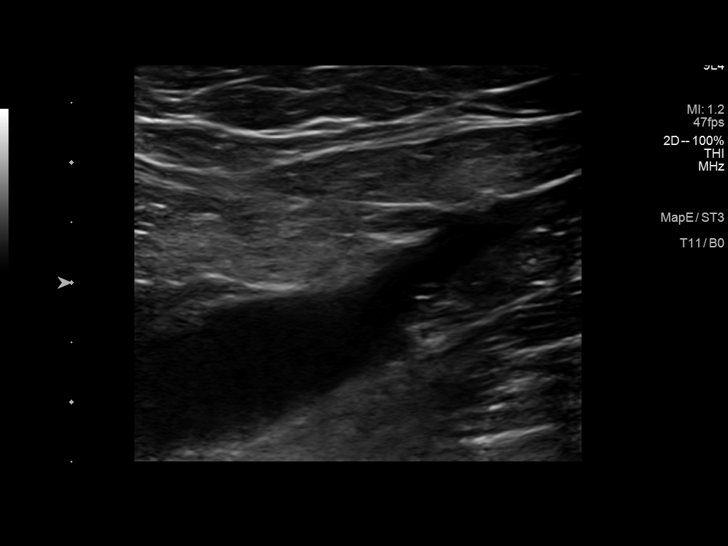
[im 15/49]
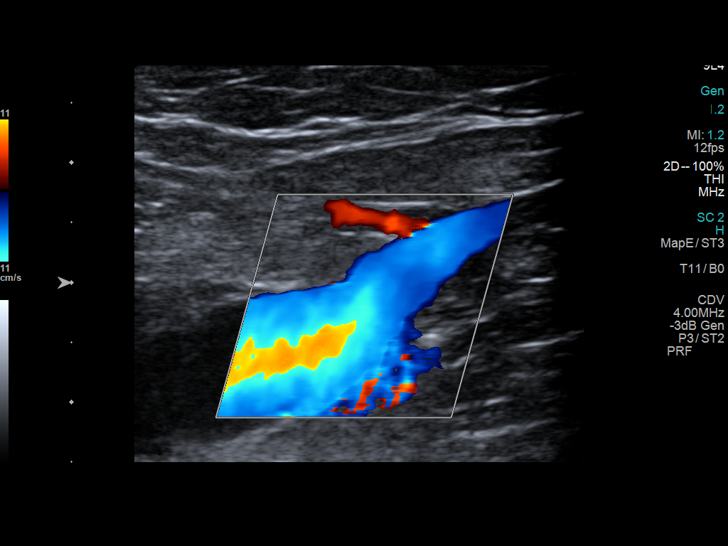
[im 19/49]
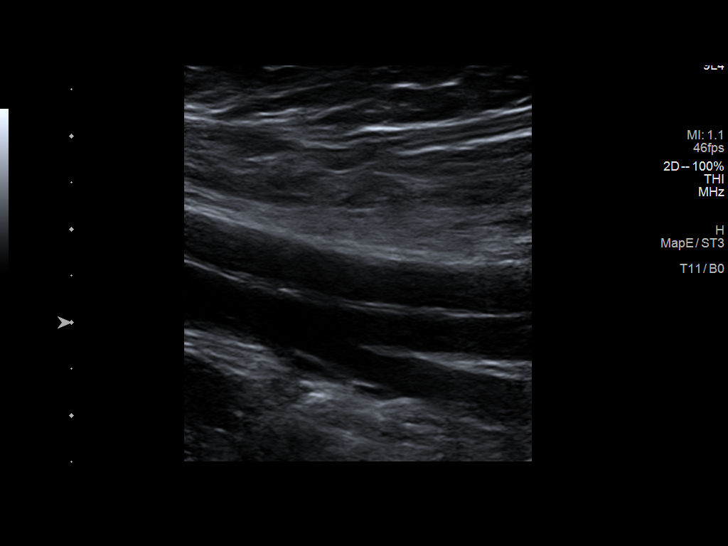
[im 23/49]
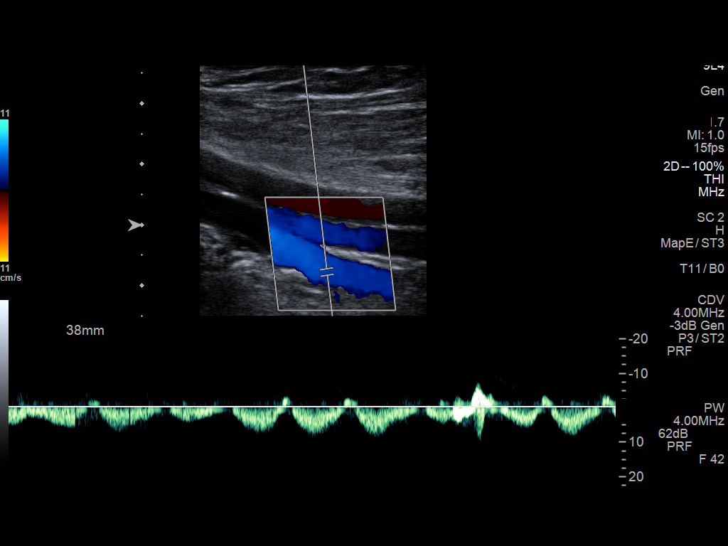
[im 26/49]
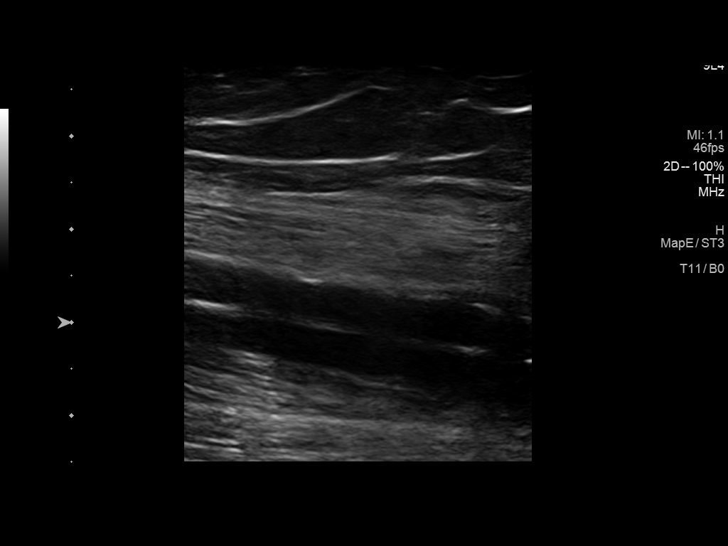
[im 30/49]
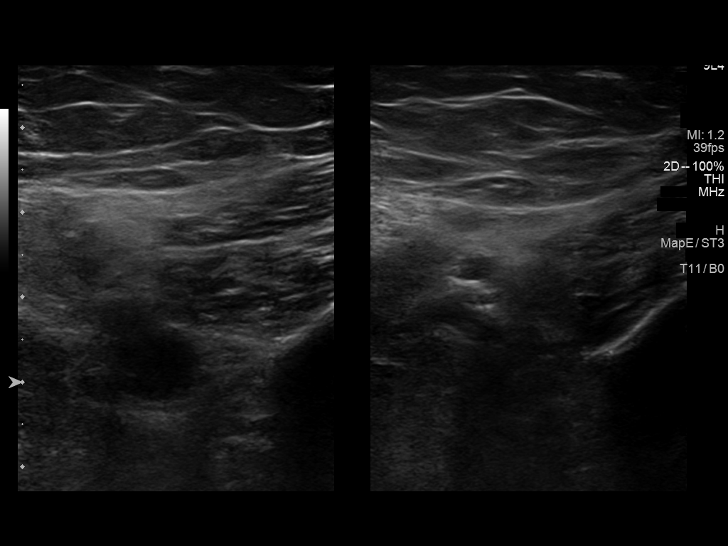
[im 34/49]
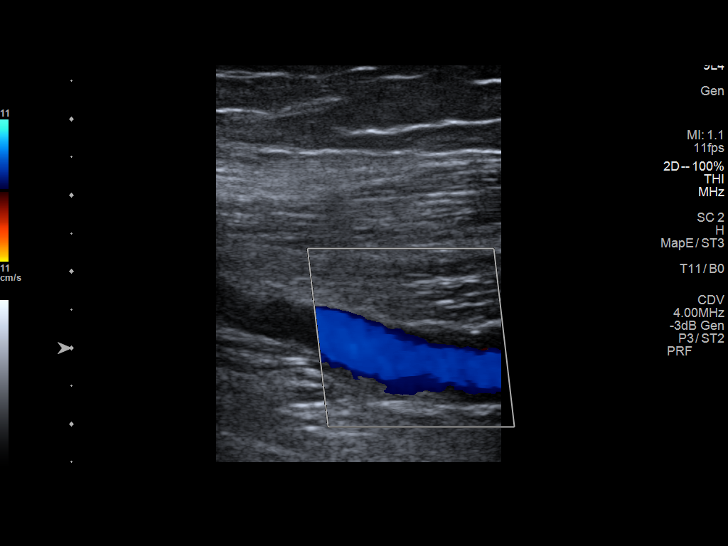
[im 38/49]
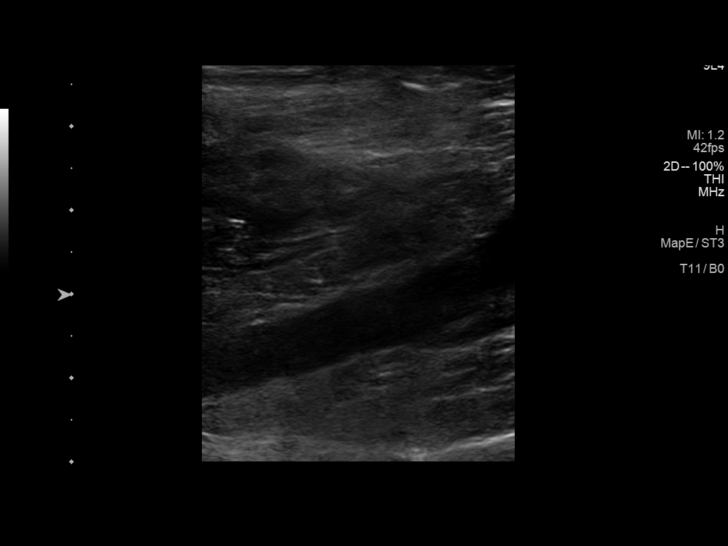
[im 40/49]
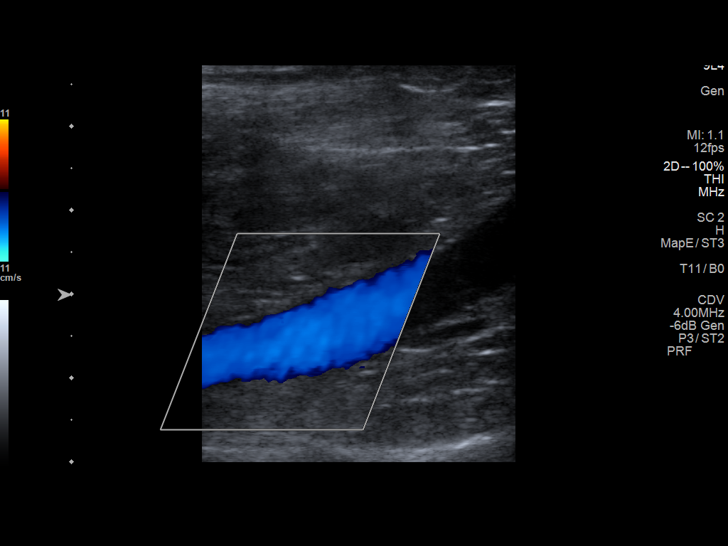
[im 44/49]
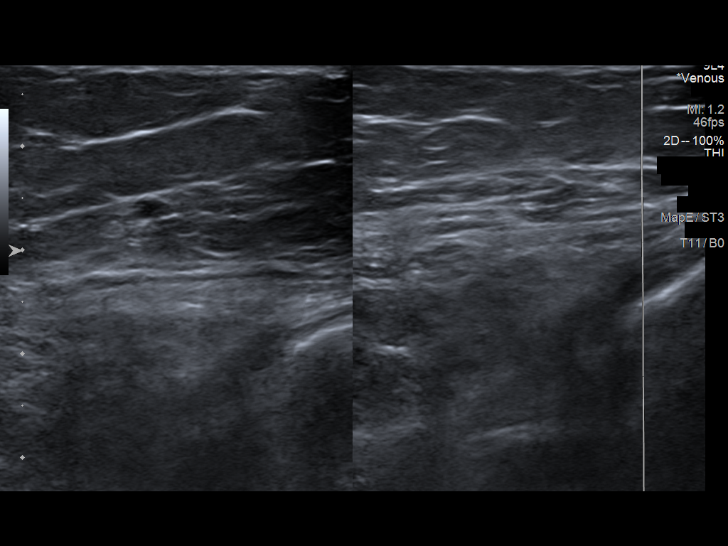
[im 49/49]
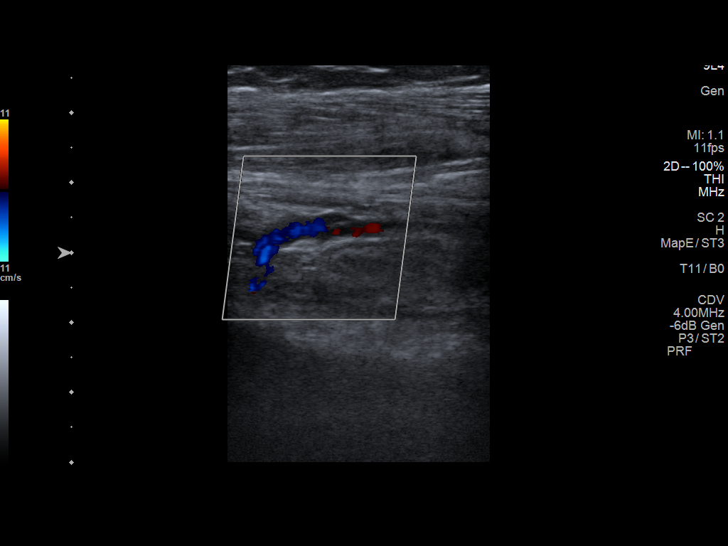

[14 of 24 positions shown; findings below may reference images not displayed]

FINDINGS: VENOUS

Normal compressibility of the common femoral, superficial femoral,
and popliteal veins, as well as the visualized calf veins.
Visualized portions of profunda femoral vein and great saphenous
vein unremarkable. No filling defects to suggest DVT on grayscale or
color Doppler imaging. Doppler waveforms show normal direction of
venous flow, normal respiratory phasicity and response to
augmentation.

Limited views of the contralateral common femoral vein are
unremarkable.

OTHER

None.

Limitations: none
IMPRESSION: No femoropopliteal DVT nor evidence of DVT within the visualized
calf veins.

If clinical symptoms are inconsistent or if there are persistent or
worsening symptoms, further imaging (possibly involving the iliac
veins) may be warranted.

## 2020-11-25 ENCOUNTER — Ambulatory Visit: Payer: 59 | Admitting: Family Medicine

## 2020-12-16 ENCOUNTER — Ambulatory Visit: Payer: 59 | Admitting: Family Medicine

## 2020-12-24 ENCOUNTER — Other Ambulatory Visit: Payer: Self-pay | Admitting: Family Medicine

## 2020-12-24 DIAGNOSIS — I10 Essential (primary) hypertension: Secondary | ICD-10-CM

## 2020-12-24 DIAGNOSIS — K219 Gastro-esophageal reflux disease without esophagitis: Secondary | ICD-10-CM

## 2020-12-25 ENCOUNTER — Ambulatory Visit: Payer: 59 | Admitting: Family Medicine

## 2021-01-02 ENCOUNTER — Other Ambulatory Visit: Payer: Self-pay | Admitting: Family Medicine

## 2021-01-07 ENCOUNTER — Other Ambulatory Visit: Payer: Self-pay

## 2021-01-07 ENCOUNTER — Encounter: Payer: Self-pay | Admitting: Family Medicine

## 2021-01-07 ENCOUNTER — Ambulatory Visit (INDEPENDENT_AMBULATORY_CARE_PROVIDER_SITE_OTHER): Payer: 59 | Admitting: Family Medicine

## 2021-01-07 VITALS — BP 123/80 | HR 84 | Temp 96.3°F | Ht 61.0 in | Wt 124.6 lb

## 2021-01-07 DIAGNOSIS — I10 Essential (primary) hypertension: Secondary | ICD-10-CM

## 2021-01-07 DIAGNOSIS — E782 Mixed hyperlipidemia: Secondary | ICD-10-CM | POA: Diagnosis not present

## 2021-01-07 DIAGNOSIS — J418 Mixed simple and mucopurulent chronic bronchitis: Secondary | ICD-10-CM

## 2021-01-07 DIAGNOSIS — N3001 Acute cystitis with hematuria: Secondary | ICD-10-CM

## 2021-01-07 DIAGNOSIS — K219 Gastro-esophageal reflux disease without esophagitis: Secondary | ICD-10-CM

## 2021-01-07 DIAGNOSIS — E039 Hypothyroidism, unspecified: Secondary | ICD-10-CM | POA: Diagnosis not present

## 2021-01-07 DIAGNOSIS — R300 Dysuria: Secondary | ICD-10-CM

## 2021-01-07 DIAGNOSIS — R252 Cramp and spasm: Secondary | ICD-10-CM

## 2021-01-07 LAB — URINALYSIS, COMPLETE
Bilirubin, UA: NEGATIVE
Glucose, UA: NEGATIVE
Ketones, UA: NEGATIVE
Nitrite, UA: POSITIVE — AB
Protein,UA: NEGATIVE
Specific Gravity, UA: 1.015 (ref 1.005–1.030)
Urobilinogen, Ur: 0.2 mg/dL (ref 0.2–1.0)
pH, UA: 5 (ref 5.0–7.5)

## 2021-01-07 LAB — MICROSCOPIC EXAMINATION

## 2021-01-07 MED ORDER — CYCLOBENZAPRINE HCL 5 MG PO TABS
5.0000 mg | ORAL_TABLET | Freq: Three times a day (TID) | ORAL | 1 refills | Status: DC | PRN
Start: 2021-01-07 — End: 2021-02-11

## 2021-01-07 MED ORDER — PHENAZOPYRIDINE HCL 100 MG PO TABS: 100.0000 mg | ORAL_TABLET | Freq: Three times a day (TID) | ORAL | 0 refills | Status: DC | PRN

## 2021-01-07 MED ORDER — NITROFURANTOIN MONOHYD MACRO 100 MG PO CAPS: 100.0000 mg | ORAL_CAPSULE | Freq: Two times a day (BID) | ORAL | 0 refills | Status: AC

## 2021-01-07 NOTE — Progress Notes (Signed)
Assessment & Plan:  1. Acquired hypothyroidism - Labs to assess. - Thyroid Panel With TSH  2. Essential (primary) hypertension - Well controlled on current regimen.  - CMP14+EGFR  3. Mixed hyperlipidemia - Labs to assess. Encouraged smoking cessation.  - Lipid panel  4. Mixed simple and mucopurulent chronic bronchitis ([place]) - Well controlled on current regimen.   5. Gastroesophageal reflux disease without esophagitis - Well controlled on current regimen.   6. Dysuria  - Urinalysis, Complete  7. Acute cystitis with hematuria - Encouraged hydration. - nitrofurantoin, macrocrystal-monohydrate, (MACROBID) 100 MG capsule; Take 1 capsule (100 mg total) by mouth 2 (two) times daily for 5 days.  Dispense: 10 capsule; Refill: 0 - phenazopyridine (PYRIDIUM) 100 MG tablet; Take 1 tablet (100 mg total) by mouth 3 (three) times daily as needed for pain.  Dispense: 10 tablet; Refill: 0 - Urine Culture  8. Leg cramping - Will start with labs to work-up.  Education provided on leg cramps. - CMP14+EGFR - Thyroid Panel With TSH - Anemia Profile B - Magnesium   Return as directed after labs result.  Hendricks Limes, MSN, APRN, FNP-C [facility]  Subjective:    Patient ID: Candice Jackson, female    DOB: 1965/12/22, 55 y.o.   MRN: [id number]  Patient Care Team: Loman Brooklyn, FNP as PCP - General (Family Medicine)   Chief Complaint:  Chief Complaint  Patient presents with  . Hypertension    Check up of chronic medical conditions   . Hypothyroidism  . Dysuria    X 4 days. Patient has been using AZO OTC     HPI: Candice Jackson is a 55 y.o. female presenting on 01/07/2021 for Hypertension (Check up of chronic medical conditions/), Hypothyroidism, and Dysuria (X 4 days. Patient has been using AZO OTC/)  Hypothyroidism: Patient's dose of levothyroxine was increased from 88 mcg to 100 mcg approximately 3 months ago.  COPD: Patient uses Symbicort  twice daily every day.  She rarely has to use her albuterol inhaler.  Hyperlipidemia: Patient is a current every day smoker.  Her 10-year ASCVD risk score is 8.8%.  She is not on medication to treat.   New complaints: Patient complains of dysuria and frequency She has had symptoms for 4 days. Patient also complains of back pain. Patient denies fever. Patient does not have a history of recurrent UTI.  Patient does not have a history of pyelonephritis.   Patient mention that she is going to schedule an appointment to come back and discuss left calf pain that has been going on for 4 to 5 months now.  She describes the pain as throbbing and only occurs when she is walking.  It resolves when she stops to rest.  It does occur daily.  Social history:  Relevant past medical, surgical, family and social history reviewed and updated as indicated. Interim medical history since our last visit reviewed.  Allergies and medications reviewed and updated.  DATA REVIEWED: CHART IN EPIC  ROS: Negative unless specifically indicated above in HPI.    Current Outpatient Medications:  .  albuterol (PROAIR HFA) 108 (90 Base) MCG/ACT inhaler, Inhale 2 puffs into the lungs every 6 (six) hours as needed., Disp: 18 g, Rfl: 11 .  budesonide-formoterol (SYMBICORT) 160-4.5 MCG/ACT inhaler, Inhale 2 puffs into the lungs 2 (two) times daily., Disp: 1 Inhaler, Rfl: 3 .  cyclobenzaprine (FLEXERIL) 5 MG tablet, Take 1 tablet (5 mg total) by mouth 3 (three) times daily as  needed for muscle spasms., Disp: 90 tablet, Rfl: 1 .  fluticasone (FLONASE) 50 MCG/ACT nasal spray, Place 2 sprays into both nostrils daily., Disp: 16 g, Rfl: 6 .  ibuprofen (ADVIL,MOTRIN) 600 MG tablet, Take 600 mg by mouth at bedtime., Disp: , Rfl:  .  levothyroxine (SYNTHROID) 100 MCG tablet, Take 1 tablet (100 mcg total) by mouth daily., Disp: 90 tablet, Rfl: 0 .  Nebivolol HCl 20 MG TABS, TAKE 1 TABLET DAILY, Disp: 90 tablet, Rfl: 0 .  nicotine  polacrilex ([employer] NICOTINE) 2 MG gum, Take 1 each (2 mg total) by mouth as needed for smoking cessation., Disp: 100 tablet, Rfl: 1 .  omeprazole (PRILOSEC) 20 MG capsule, TAKE 1 CAPSULE DAILY, Disp: 90 capsule, Rfl: 0   Allergies  Allergen Reactions  . Cephalexin Palpitations  . Levofloxacin Hives  . Amoxicillin-Pot Clavulanate Nausea And Vomiting  . Ketek [Telithromycin] Palpitations  . Statins Rash   Past Medical History:  Diagnosis Date  . Allergic rhinitis   . Allergy   . Chronic bronchitis ([place])   . Constipation   . GERD (gastroesophageal reflux disease)   . Hemorrhoid   . HTN (hypertension)   . Hyperlipidemia   . Sinusitis   . Thyroid disease     Past Surgical History:  Procedure Laterality Date  . HEMORROIDECTOMY    . R ankle surgery    . TOTAL ABDOMINAL HYSTERECTOMY      Social History   Socioeconomic History  . Marital status: Married    Spouse name: [name]  . Number of children: 2  . Years of education: Not on file  . Highest education level: Not on file  Occupational History  . Occupation: Therapist, music: [employer]  Tobacco Use  . Smoking status: Current Every Day Smoker    Packs/day: 0.80    Years: 20.00    Pack years: 16.00    Types: Cigarettes  . Smokeless tobacco: Never Used  Vaping Use  . Vaping Use: Never used  Substance and Sexual Activity  . Alcohol use: Yes    Comment: 3-4 beers a night  . Drug use: Never  . Sexual activity: Not on file  Other Topics Concern  . Not on file  Social History Narrative  . Not on file   Social Determinants of Health   Financial Resource Strain: Not on file  Food Insecurity: Not on file  Transportation Needs: Not on file  Physical Activity: Not on file  Stress: Not on file  Social Connections: Not on file  Intimate Partner Violence: Not on file        Objective:    BP 123/80   Pulse 84   Temp (!) 96.3 F (35.7 C) (Temporal)   Ht 5' 1"  (1.549 m)   Wt 124 lb 9.6 oz (56.5 kg)    SpO2 100%   BMI 23.54 kg/m   Wt Readings from Last 3 Encounters:  01/07/21 124 lb 9.6 oz (56.5 kg)  11/04/20 121 lb (54.9 kg)  07/02/20 127 lb (57.6 kg)    Physical Exam Vitals reviewed.  Constitutional:      General: She is not in acute distress.    Appearance: Normal appearance. She is normal weight. She is not ill-appearing, toxic-appearing or diaphoretic.  HENT:     Head: Normocephalic and atraumatic.  Eyes:     General: No scleral icterus.       Right eye: No discharge.  Left eye: No discharge.     Conjunctiva/sclera: Conjunctivae normal.  Cardiovascular:     Rate and Rhythm: Normal rate and regular rhythm.     Heart sounds: Normal heart sounds. No murmur heard. No friction rub. No gallop.   Pulmonary:     Effort: Pulmonary effort is normal. No respiratory distress.     Breath sounds: Normal breath sounds. No stridor. No wheezing, rhonchi or rales.  Musculoskeletal:        General: Normal range of motion.     Cervical back: Normal range of motion.  Skin:    General: Skin is warm and dry.     Capillary Refill: Capillary refill takes less than 2 seconds.  Neurological:     General: No focal deficit present.     Mental Status: She is alert and oriented to person, place, and time. Mental status is at baseline.  Psychiatric:        Mood and Affect: Mood normal.        Behavior: Behavior normal.        Thought Content: Thought content normal.        Judgment: Judgment normal.     Lab Results  Component Value Date   TSH 0.117 (L) 11/04/2020   Lab Results  Component Value Date   WBC 9.1 07/02/2020   HGB 15.1 07/02/2020   HCT 43.4 07/02/2020   MCV 92 07/02/2020   PLT 280 07/02/2020   Lab Results  Component Value Date   NA 136 07/02/2020   K 5.1 07/02/2020   CO2 23 07/02/2020   GLUCOSE 81 07/02/2020   BUN 6 07/02/2020   CREATININE 0.72 07/02/2020   BILITOT 0.4 07/02/2020   ALKPHOS 93 07/02/2020   AST 19 07/02/2020   ALT 22 07/02/2020   PROT  7.4 07/02/2020   ALBUMIN 4.7 07/02/2020   CALCIUM 9.8 07/02/2020   Lab Results  Component Value Date   CHOL 277 (H) 07/02/2020   Lab Results  Component Value Date   HDL 55 07/02/2020   Lab Results  Component Value Date   LDLCALC 179 (H) 07/02/2020   Lab Results  Component Value Date   TRIG 230 (H) 07/02/2020   Lab Results  Component Value Date   CHOLHDL 5.0 (H) 07/02/2020   No results found for: HGBA1C

## 2021-01-07 NOTE — Patient Instructions (Signed)
Leg Cramps Leg cramps occur when one or more muscles tighten and a person has no control over it (involuntary muscle contraction). Muscle cramps are most common in the calf muscles of the leg. They can occur during exercise or at rest. Leg cramps are painful, and they may last for a few seconds to a few minutes. Cramps may return several times before they finally stop. Usually, leg cramps are not caused by a serious medical problem. In many cases, the cause is not known. Some common causes include:  Excessive physical effort (overexertion), such as during intense exercise.  Doing the same motion over and over.  Staying in a certain position for a long period of time.  Improper preparation, form, or technique while doing a sport or an activity.  Dehydration.  Injury.  Side effects of certain medicines.  Abnormally low levels of minerals in your blood (electrolytes), especially potassium and calcium. This could result from: ? Pregnancy. ? Taking diuretic medicines. Follow these instructions at home: Eating and drinking  Drink enough fluid to keep your urine pale yellow. Staying hydrated may help prevent cramps.  Eat a healthy diet that includes plenty of nutrients to help your muscles function. A healthy diet includes fruits and vegetables, lean protein, whole grains, and low-fat or nonfat dairy products. Managing pain, stiffness, and swelling  Try massaging, stretching, and relaxing the affected muscle. Do this for several minutes at a time.  If directed, put ice on areas that are sore or painful after a cramp. To do this: ? Put ice in a plastic bag. ? Place a towel between your skin and the bag. ? Leave the ice on for 20 minutes, 2-3 times a day. ? Remove the ice if your skin turns bright red. This is very important. If you cannot feel pain, heat, or cold, you have a greater risk of damage to the area.  If directed, apply heat to muscles that are tense or tight. Do this before  you exercise, or as often as told by your health care provider. Use the heat source that your health care provider recommends, such as a moist heat pack or a heating pad. To do this: ? Place a towel between your skin and the heat source. ? Leave the heat on for 20-30 minutes. ? Remove the heat if your skin turns bright red. This is especially important if you are unable to feel pain, heat, or cold. You may have a greater risk of getting burned.  Try taking hot showers or baths to help relax tight muscles.      General instructions  If you are having frequent leg cramps, avoid intense exercise for several days.  Take over-the-counter and prescription medicines only as told by your health care provider.  Keep all follow-up visits. This is important. Contact a health care provider if:  Your leg cramps get more severe or more frequent, or they do not improve over time.  Your foot becomes cold, numb, or blue. Summary  Muscle cramps can develop in any muscle, but the most common place is in the calf muscles of the leg.  Leg cramps are painful, and they may last for a few seconds to a few minutes.  Usually, leg cramps are not caused by a serious medical problem. Often, the cause is not known.  Stay hydrated, and take over-the-counter and prescription medicines only as told by your health care provider. This information is not intended to replace advice given to you by your   health care provider. Make sure you discuss any questions you have with your health care provider. Document Revised: 04/02/2020 Document Reviewed: 04/02/2020 Elsevier Patient Education  2021 [employer] Inc[employer]  

## 2021-01-08 ENCOUNTER — Encounter: Payer: Self-pay | Admitting: Family Medicine

## 2021-01-08 ENCOUNTER — Other Ambulatory Visit: Payer: Self-pay | Admitting: Family Medicine

## 2021-01-08 DIAGNOSIS — E039 Hypothyroidism, unspecified: Secondary | ICD-10-CM

## 2021-01-08 HISTORY — DX: Hypomagnesemia: E83.42

## 2021-01-08 LAB — CMP14+EGFR
ALT: 13 IU/L (ref 0–32)
AST: 17 IU/L (ref 0–40)
Albumin/Globulin Ratio: 1.9 (ref 1.2–2.2)
Albumin: 4.5 g/dL (ref 3.8–4.9)
Alkaline Phosphatase: 81 IU/L (ref 44–121)
BUN/Creatinine Ratio: 8 — ABNORMAL LOW (ref 9–23)
BUN: 5 mg/dL — ABNORMAL LOW (ref 6–24)
Bilirubin Total: 0.4 mg/dL (ref 0.0–1.2)
CO2: 21 mmol/L (ref 20–29)
Calcium: 9.8 mg/dL (ref 8.7–10.2)
Chloride: 100 mmol/L (ref 96–106)
Creatinine, Ser: 0.66 mg/dL (ref 0.57–1.00)
GFR calc Af Amer: 114 mL/min/{1.73_m2} (ref >59)
GFR calc non Af Amer: 99 mL/min/{1.73_m2} (ref >59)
Globulin, Total: 2.4 g/dL (ref 1.5–4.5)
Glucose: 90 mg/dL (ref 65–99)
Potassium: 4.7 mmol/L (ref 3.5–5.2)
Sodium: 139 mmol/L (ref 134–144)
Total Protein: 6.9 g/dL (ref 6.0–8.5)

## 2021-01-08 LAB — ANEMIA PROFILE B
Basophils Absolute: 0.1 10*3/uL (ref 0.0–0.2)
Basos: 1 %
EOS (ABSOLUTE): 0.2 10*3/uL (ref 0.0–0.4)
Eos: 3 %
Ferritin: 239 ng/mL — ABNORMAL HIGH (ref 15–150)
Folate: 9.6 ng/mL (ref >3.0)
Hematocrit: 40.9 % (ref 34.0–46.6)
Hemoglobin: 14.1 g/dL (ref 11.1–15.9)
Immature Grans (Abs): 0 10*3/uL (ref 0.0–0.1)
Immature Granulocytes: 0 %
Iron Saturation: 28 % (ref 15–55)
Iron: 77 ug/dL (ref 27–159)
Lymphocytes Absolute: 2.8 10*3/uL (ref 0.7–3.1)
Lymphs: 33 %
MCH: 31.8 pg (ref 26.6–33.0)
MCHC: 34.5 g/dL (ref 31.5–35.7)
MCV: 92 fL (ref 79–97)
Monocytes Absolute: 0.7 10*3/uL (ref 0.1–0.9)
Monocytes: 8 %
Neutrophils Absolute: 4.7 10*3/uL (ref 1.4–7.0)
Neutrophils: 55 %
Platelets: 237 10*3/uL (ref 150–450)
RBC: 4.43 x10E6/uL (ref 3.77–5.28)
RDW: 11.9 % (ref 11.7–15.4)
Retic Ct Pct: 1.5 % (ref 0.6–2.6)
Total Iron Binding Capacity: 273 ug/dL (ref 250–450)
UIBC: 196 ug/dL (ref 131–425)
Vitamin B-12: 369 pg/mL (ref 232–1245)
WBC: 8.5 10*3/uL (ref 3.4–10.8)

## 2021-01-08 LAB — LIPID PANEL
Chol/HDL Ratio: 4.1 ratio (ref 0.0–4.4)
Cholesterol, Total: 244 mg/dL — ABNORMAL HIGH (ref 100–199)
HDL: 59 mg/dL (ref >39)
LDL Chol Calc (NIH): 164 mg/dL — ABNORMAL HIGH (ref 0–99)
Triglycerides: 116 mg/dL (ref 0–149)
VLDL Cholesterol Cal: 21 mg/dL (ref 5–40)

## 2021-01-08 LAB — THYROID PANEL WITH TSH
Free Thyroxine Index: 2.8 (ref 1.2–4.9)
T3 Uptake Ratio: 30 % (ref 24–39)
T4, Total: 9.3 ug/dL (ref 4.5–12.0)
TSH: 0.107 u[IU]/mL — ABNORMAL LOW (ref 0.450–4.500)

## 2021-01-08 LAB — MAGNESIUM: Magnesium: 1.5 mg/dL — ABNORMAL LOW (ref 1.6–2.3)

## 2021-01-08 MED ORDER — LEVOTHYROXINE SODIUM 75 MCG PO TABS: 75.0000 ug | ORAL_TABLET | Freq: Every day | ORAL | 2 refills | Status: DC

## 2021-01-10 LAB — URINE CULTURE

## 2021-01-13 NOTE — Progress Notes (Signed)
Pt r/c about labs 

## 2021-02-11 ENCOUNTER — Other Ambulatory Visit: Payer: Self-pay | Admitting: *Deleted

## 2021-02-11 DIAGNOSIS — J019 Acute sinusitis, unspecified: Secondary | ICD-10-CM

## 2021-02-11 DIAGNOSIS — E039 Hypothyroidism, unspecified: Secondary | ICD-10-CM

## 2021-02-11 DIAGNOSIS — B9689 Other specified bacterial agents as the cause of diseases classified elsewhere: Secondary | ICD-10-CM

## 2021-02-11 MED ORDER — LEVOTHYROXINE SODIUM 75 MCG PO TABS: 75.0000 ug | ORAL_TABLET | Freq: Every day | ORAL | 0 refills | Status: DC

## 2021-02-11 MED ORDER — CYCLOBENZAPRINE HCL 5 MG PO TABS
5.0000 mg | ORAL_TABLET | Freq: Three times a day (TID) | ORAL | 1 refills | Status: DC | PRN
Start: 2021-02-11 — End: 2021-12-09

## 2021-02-11 MED ORDER — FLUTICASONE PROPIONATE 50 MCG/ACT NA SUSP: 2.0000 | Freq: Every day | NASAL | 1 refills | Status: DC

## 2021-02-23 ENCOUNTER — Telehealth: Payer: Self-pay | Admitting: *Deleted

## 2021-02-23 NOTE — Telephone Encounter (Signed)
Fax from [facility] RF request for Ezetimibe 10 mg Zetia not on current med list

## 2021-02-24 NOTE — Telephone Encounter (Signed)
Patient states she is not on Zetia.

## 2021-02-24 NOTE — Telephone Encounter (Signed)
Can we please ask patient if she is taking this medication. I see it was previously on her list.

## 2021-03-24 ENCOUNTER — Other Ambulatory Visit: Payer: Self-pay | Admitting: Family Medicine

## 2021-03-24 DIAGNOSIS — K219 Gastro-esophageal reflux disease without esophagitis: Secondary | ICD-10-CM

## 2021-03-24 DIAGNOSIS — I10 Essential (primary) hypertension: Secondary | ICD-10-CM

## 2021-04-08 ENCOUNTER — Encounter: Payer: Self-pay | Admitting: Family Medicine

## 2021-04-08 ENCOUNTER — Ambulatory Visit (INDEPENDENT_AMBULATORY_CARE_PROVIDER_SITE_OTHER): Payer: BC Managed Care – PPO | Admitting: Family Medicine

## 2021-04-08 ENCOUNTER — Other Ambulatory Visit: Payer: Self-pay

## 2021-04-08 VITALS — BP 128/83 | HR 77 | Temp 97.2°F | Ht 61.0 in | Wt 127.4 lb

## 2021-04-08 DIAGNOSIS — M545 Low back pain, unspecified: Secondary | ICD-10-CM | POA: Diagnosis not present

## 2021-04-08 MED ORDER — METHOCARBAMOL 500 MG PO TABS: 500.0000 mg | ORAL_TABLET | Freq: Three times a day (TID) | ORAL | 2 refills | Status: DC | PRN

## 2021-04-08 NOTE — Patient Instructions (Signed)
Tylenol 500 mg with Ibuprofen 400 mg every 6-8 hours as needed for pain. Biofreeze Heating pad Robaxin (muscle relaxer)   Low Back Sprain or Strain Rehab Ask your health care provider which exercises are safe for you. Do exercises exactly as told by your health care provider and adjust them as directed. It is normal to feel mild stretching, pulling, tightness, or discomfort as you do these exercises. Stop right away if you feel sudden pain or your pain gets worse. Do not begin these exercises until told by your health care provider. Stretching and range-of-motion exercises These exercises warm up your muscles and joints and improve the movement and flexibility of your back. These exercises also help to relieve pain, numbness, and tingling. Lumbar rotation 1. Lie on your back on a firm surface and bend your knees. 2. Straighten your arms out to your sides so each arm forms a 90-degree angle (right angle) with a side of your body. 3. Slowly move (rotate) both of your knees to one side of your body until you feel a stretch in your lower back (lumbar). Try not to let your shoulders lift off the floor. 4. Hold this position for __________ seconds. 5. Tense your abdominal muscles and slowly move your knees back to the starting position. 6. Repeat this exercise on the other side of your body. Repeat __________ times. Complete this exercise __________ times a day.   Single knee to chest 1. Lie on your back on a firm surface with both legs straight. 2. Bend one of your knees. Use your hands to move your knee up toward your chest until you feel a gentle stretch in your lower back and buttock. ? Hold your leg in this position by holding on to the front of your knee. ? Keep your other leg as straight as possible. 3. Hold this position for __________ seconds. 4. Slowly return to the starting position. 5. Repeat with your other leg. Repeat __________ times. Complete this exercise __________ times a day.    Prone extension on elbows 1. Lie on your abdomen on a firm surface (prone position). 2. Prop yourself up on your elbows. 3. Use your arms to help lift your chest up until you feel a gentle stretch in your abdomen and your lower back. ? This will place some of your body weight on your elbows. If this is uncomfortable, try stacking pillows under your chest. ? Your hips should stay down, against the surface that you are lying on. Keep your hip and back muscles relaxed. 4. Hold this position for __________ seconds. 5. Slowly relax your upper body and return to the starting position. Repeat __________ times. Complete this exercise __________ times a day.   Strengthening exercises These exercises build strength and endurance in your back. Endurance is the ability to use your muscles for a long time, even after they get tired. Pelvic tilt This exercise strengthens the muscles that lie deep in the abdomen. 1. Lie on your back on a firm surface. Bend your knees and keep your feet flat on the floor. 2. Tense your abdominal muscles. Tip your pelvis up toward the ceiling and flatten your lower back into the floor. ? To help with this exercise, you may place a small towel under your lower back and try to push your back into the towel. 3. Hold this position for __________ seconds. 4. Let your muscles relax completely before you repeat this exercise. Repeat __________ times. Complete this exercise __________ times a day.  Alternating arm and leg raises 1. Get on your hands and knees on a firm surface. If you are on a hard floor, you may want to use padding, such as an exercise mat, to cushion your knees. 2. Line up your arms and legs. Your hands should be directly below your shoulders, and your knees should be directly below your hips. 3. Lift your left leg behind you. At the same time, raise your right arm and straighten it in front of you. ? Do not lift your leg higher than your hip. ? Do not lift your  arm higher than your shoulder. ? Keep your abdominal and back muscles tight. ? Keep your hips facing the ground. ? Do not arch your back. ? Keep your balance carefully, and do not hold your breath. 4. Hold this position for __________ seconds. 5. Slowly return to the starting position. 6. Repeat with your right leg and your left arm. Repeat __________ times. Complete this exercise __________ times a day.   Abdominal set with straight leg raise 1. Lie on your back on a firm surface. 2. Bend one of your knees and keep your other leg straight. 3. Tense your abdominal muscles and lift your straight leg up, 4-6 inches (10-15 cm) off the ground. 4. Keep your abdominal muscles tight and hold this position for __________ seconds. ? Do not hold your breath. ? Do not arch your back. Keep it flat against the ground. 5. Keep your abdominal muscles tense as you slowly lower your leg back to the starting position. 6. Repeat with your other leg. Repeat __________ times. Complete this exercise __________ times a day.   Single leg lower with bent knees 1. Lie on your back on a firm surface. 2. Tense your abdominal muscles and lift your feet off the floor, one foot at a time, so your knees and hips are bent in 90-degree angles (right angles). ? Your knees should be over your hips and your lower legs should be parallel to the floor. 3. Keeping your abdominal muscles tense and your knee bent, slowly lower one of your legs so your toe touches the ground. 4. Lift your leg back up to return to the starting position. ? Do not hold your breath. ? Do not let your back arch. Keep your back flat against the ground. 5. Repeat with your other leg. Repeat __________ times. Complete this exercise __________ times a day. Posture and body mechanics Good posture and healthy body mechanics can help to relieve stress in your body's tissues and joints. Body mechanics refers to the movements and positions of your body while  you do your daily activities. Posture is part of body mechanics. Good posture means:  Your spine is in its natural S-curve position (neutral).  Your shoulders are pulled back slightly.  Your head is not tipped forward. Follow these guidelines to improve your posture and body mechanics in your everyday activities. Standing  When standing, keep your spine neutral and your feet about hip width apart. Keep a slight bend in your knees. Your ears, shoulders, and hips should line up.  When you do a task in which you stand in one place for a long time, place one foot up on a stable object that is 2-4 inches (5-10 cm) high, such as a footstool. This helps keep your spine neutral.   Sitting  When sitting, keep your spine neutral and keep your feet flat on the floor. Use a footrest, if necessary, and keep your  thighs parallel to the floor. Avoid rounding your shoulders, and avoid tilting your head forward.  When working at a desk or a computer, keep your desk at a height where your hands are slightly lower than your elbows. Slide your chair under your desk so you are close enough to maintain good posture.  When working at a computer, place your monitor at a height where you are looking straight ahead and you do not have to tilt your head forward or downward to look at the screen.   Resting  When lying down and resting, avoid positions that are most painful for you.  If you have pain with activities such as sitting, bending, stooping, or squatting, lie in a position in which your body does not bend very much. For example, avoid curling up on your side with your arms and knees near your chest (fetal position).  If you have pain with activities such as standing for a long time or reaching with your arms, lie with your spine in a neutral position and bend your knees slightly. Try the following positions: ? Lying on your side with a pillow between your knees. ? Lying on your back with a pillow under your  knees. Lifting  When lifting objects, keep your feet at least shoulder width apart and tighten your abdominal muscles.  Bend your knees and hips and keep your spine neutral. It is important to lift using the strength of your legs, not your back. Do not lock your knees straight out.  Always ask for help to lift heavy or awkward objects.   This information is not intended to replace advice given to you by your health care provider. Make sure you discuss any questions you have with your health care provider. Document Revised: 03/09/2019 Document Reviewed: 12/07/2018 Elsevier Patient Education  2021 [employer].

## 2021-04-08 NOTE — Progress Notes (Signed)
Assessment & Plan:  1. Acute right-sided low back pain without sciatica Tylenol. Biofreeze, Heating pad. Muscle relaxer (Robaxin) - patient understands she cannot take this and Flexeril. Exercises provided for low back.  - methocarbamol (ROBAXIN) 500 MG tablet; Take 1 tablet (500 mg total) by mouth every 8 (eight) hours as needed for muscle spasms.  Dispense: 30 tablet; Refill: 2   Follow up plan: Return if symptoms worsen or fail to improve.  Deliah Boston, MSN, APRN, FNP-C [facility]  Subjective:   Patient ID: Candice Jackson, female    DOB: 1966/04/09, 55 y.o.   MRN: [id number]  HPI: Candice Jackson is a 55 y.o. female presenting on 04/08/2021 for Back Pain (X 2-3 months/)  Patient reports back pain on the right side x2-3 months. She describes the pain as contact, aching, and sometimes stabbing. She rates the pain 10/10 at times but 5/10 on average. She has been taking Ibuprofen 400 mg at bedtime, applying heat and Biofreeze which has been helpful. She has a prescription for Flexeril but does not take it due to it making her drowsy.    ROS: Negative unless specifically indicated above in HPI.   Relevant past medical history reviewed and updated as indicated.   Allergies and medications reviewed and updated.   Current Outpatient Medications:  .  albuterol (PROAIR HFA) 108 (90 Base) MCG/ACT inhaler, Inhale 2 puffs into the lungs every 6 (six) hours as needed., Disp: 18 g, Rfl: 11 .  budesonide-formoterol (SYMBICORT) 160-4.5 MCG/ACT inhaler, Inhale 2 puffs into the lungs 2 (two) times daily., Disp: 1 Inhaler, Rfl: 3 .  cyclobenzaprine (FLEXERIL) 5 MG tablet, Take 1 tablet (5 mg total) by mouth 3 (three) times daily as needed for muscle spasms., Disp: 90 tablet, Rfl: 1 .  fluticasone (FLONASE) 50 MCG/ACT nasal spray, Place 2 sprays into both nostrils daily., Disp: 48 g, Rfl: 1 .  ibuprofen (ADVIL,MOTRIN) 600 MG tablet, Take 600 mg by mouth at bedtime.,  Disp: , Rfl:  .  levothyroxine (SYNTHROID) 75 MCG tablet, Take 1 tablet (75 mcg total) by mouth daily., Disp: 90 tablet, Rfl: 0 .  Nebivolol HCl 20 MG TABS, TAKE 1 TABLET DAILY, Disp: 90 tablet, Rfl: 0 .  nicotine polacrilex ([employer] NICOTINE) 2 MG gum, Take 1 each (2 mg total) by mouth as needed for smoking cessation., Disp: 100 tablet, Rfl: 1 .  omeprazole (PRILOSEC) 20 MG capsule, TAKE 1 CAPSULE DAILY, Disp: 90 capsule, Rfl: 0 .  phenazopyridine (PYRIDIUM) 100 MG tablet, Take 1 tablet (100 mg total) by mouth 3 (three) times daily as needed for pain., Disp: 10 tablet, Rfl: 0  Allergies  Allergen Reactions  . Cephalexin Palpitations  . Levofloxacin Hives  . Amoxicillin-Pot Clavulanate Nausea And Vomiting  . Ketek [Telithromycin] Palpitations  . Statins Rash    Objective:   BP 128/83   Pulse 77   Temp (!) 97.2 F (36.2 C) (Temporal)   Ht 5\' 1"  (1.549 m)   Wt 127 lb 6.4 oz (57.8 kg)   SpO2 97%   BMI 24.07 kg/m    Physical Exam Vitals reviewed.  Constitutional:      General: She is not in acute distress.    Appearance: Normal appearance. She is normal weight. She is not ill-appearing, toxic-appearing or diaphoretic.  HENT:     Head: Normocephalic and atraumatic.  Eyes:     General: No scleral icterus.       Right eye: No discharge.  Left eye: No discharge.     Conjunctiva/sclera: Conjunctivae normal.  Cardiovascular:     Rate and Rhythm: Normal rate.  Pulmonary:     Effort: Pulmonary effort is normal. No respiratory distress.  Musculoskeletal:        General: Normal range of motion.     Cervical back: Normal range of motion.     Lumbar back: Tenderness (right side) present. No bony tenderness. Negative right straight leg raise test and negative left straight leg raise test.  Skin:    General: Skin is warm and dry.     Capillary Refill: Capillary refill takes less than 2 seconds.  Neurological:     General: No focal deficit present.     Mental Status: She is alert  and oriented to person, place, and time. Mental status is at baseline.  Psychiatric:        Mood and Affect: Mood normal.        Behavior: Behavior normal.        Thought Content: Thought content normal.        Judgment: Judgment normal.

## 2021-04-12 ENCOUNTER — Encounter: Payer: Self-pay | Admitting: Family Medicine

## 2021-04-15 ENCOUNTER — Ambulatory Visit: Payer: 59 | Admitting: Family Medicine

## 2021-05-12 ENCOUNTER — Other Ambulatory Visit: Payer: Self-pay | Admitting: Family Medicine

## 2021-05-12 DIAGNOSIS — E039 Hypothyroidism, unspecified: Secondary | ICD-10-CM

## 2021-05-12 NOTE — Telephone Encounter (Signed)
Britney. NTBS for repeat labs. Mail order sent

## 2021-07-09 ENCOUNTER — Other Ambulatory Visit: Payer: Self-pay

## 2021-07-09 ENCOUNTER — Ambulatory Visit (INDEPENDENT_AMBULATORY_CARE_PROVIDER_SITE_OTHER): Payer: BC Managed Care – PPO

## 2021-07-09 ENCOUNTER — Encounter: Payer: Self-pay | Admitting: Family Medicine

## 2021-07-09 ENCOUNTER — Ambulatory Visit: Payer: BC Managed Care – PPO | Admitting: Family Medicine

## 2021-07-09 ENCOUNTER — Other Ambulatory Visit: Payer: Self-pay | Admitting: Family Medicine

## 2021-07-09 VITALS — BP 129/82 | HR 92 | Temp 98.8°F | Wt 129.2 lb

## 2021-07-09 DIAGNOSIS — R1032 Left lower quadrant pain: Secondary | ICD-10-CM

## 2021-07-09 DIAGNOSIS — B9689 Other specified bacterial agents as the cause of diseases classified elsewhere: Secondary | ICD-10-CM

## 2021-07-09 DIAGNOSIS — J019 Acute sinusitis, unspecified: Secondary | ICD-10-CM

## 2021-07-09 LAB — MICROSCOPIC EXAMINATION
Bacteria, UA: NONE SEEN
Renal Epithel, UA: NONE SEEN /hpf
WBC, UA: NONE SEEN /hpf (ref 0–5)

## 2021-07-09 LAB — URINALYSIS, ROUTINE W REFLEX MICROSCOPIC
Bilirubin, UA: NEGATIVE
Glucose, UA: NEGATIVE
Ketones, UA: NEGATIVE
Leukocytes,UA: NEGATIVE
Nitrite, UA: NEGATIVE
Protein,UA: NEGATIVE
Specific Gravity, UA: 1.015 (ref 1.005–1.030)
Urobilinogen, Ur: 0.2 mg/dL (ref 0.2–1.0)
pH, UA: 5.5 (ref 5.0–7.5)

## 2021-07-09 MED ORDER — METRONIDAZOLE 500 MG PO TABS
500.0000 mg | ORAL_TABLET | Freq: Three times a day (TID) | ORAL | 0 refills | Status: AC
Start: 2021-07-09 — End: 2021-07-16

## 2021-07-09 MED ORDER — CIPROFLOXACIN HCL 500 MG PO TABS: 500.0000 mg | ORAL_TABLET | Freq: Two times a day (BID) | ORAL | 0 refills | Status: AC

## 2021-07-09 NOTE — Patient Instructions (Signed)

## 2021-07-09 NOTE — Progress Notes (Signed)
Acute Office Visit  Subjective:    Patient ID: Candice Jackson, female    DOB: February 29, 1966, 55 y.o.   MRN: [id number]  Chief Complaint  Patient presents with   Abdominal Pain    HPI Patient is in today for abdominal pain x 1 day. The pain is in her lower abdomen. It feels like cramping. It is intermittent. She feels like it is getting worse. It sometimes radiates to her left lower back. She did have diarrhea 2 days ago but reports normal BM since. She denies constipation or straining. Has had a daily BM. Denies nausea, vomiting, fever, dysuria, blood in urine or stool. She also feels a little bloated. She denies changes in appetite.    Past Medical History:  Diagnosis Date   Allergic rhinitis    Allergy    Chronic bronchitis (HCC)    Constipation    GERD (gastroesophageal reflux disease)    Hemorrhoid    HTN (hypertension)    Hyperlipidemia    Hypomagnesemia 01/08/2021   Sinusitis    Thyroid disease     Past Surgical History:  Procedure Laterality Date   HEMORROIDECTOMY     R ankle surgery     TOTAL ABDOMINAL HYSTERECTOMY      Family History  Problem Relation Age of Onset   Heart disease Father    Stroke Father    Cancer Mother        brain tumor   Hypertension Sister    Arthritis Sister    Heart attack Brother    Hypertension Brother    Hypertension Daughter    Gout Son    Diabetes Brother    Hypertension Brother    Arthritis Brother    Hypertension Brother     Social History   Socioeconomic History   Marital status: Married    Spouse name: Merchant navy officer   Number of children: 2   Years of education: Not on file   Highest education level: Not on file  Occupational History   Occupation: Risk manager: [employer]  Tobacco Use   Smoking status: Every Day    Packs/day: 0.80    Years: 20.00    Pack years: 16.00    Types: Cigarettes   Smokeless tobacco: Never  Vaping Use   Vaping Use: Never used  Substance and Sexual Activity    Alcohol use: Yes    Comment: 3-4 beers a night   Drug use: Never   Sexual activity: Not on file  Other Topics Concern   Not on file  Social History Narrative   Not on file   Social Determinants of Health   Financial Resource Strain: Not on file  Food Insecurity: Not on file  Transportation Needs: Not on file  Physical Activity: Not on file  Stress: Not on file  Social Connections: Not on file  Intimate Partner Violence: Not on file    Outpatient Medications Prior to Visit  Medication Sig Dispense Refill   albuterol (PROAIR HFA) 108 (90 Base) MCG/ACT inhaler Inhale 2 puffs into the lungs every 6 (six) hours as needed. 18 g 11   budesonide-formoterol (SYMBICORT) 160-4.5 MCG/ACT inhaler Inhale 2 puffs into the lungs 2 (two) times daily. 1 Inhaler 3   cyclobenzaprine (FLEXERIL) 5 MG tablet Take 1 tablet (5 mg total) by mouth 3 (three) times daily as needed for muscle spasms. 90 tablet 1   fluticasone (FLONASE) 50 MCG/ACT nasal spray USE 2 SPRAYS IN EACH NOSTRIL DAILY 48  g 0   ibuprofen (ADVIL,MOTRIN) 600 MG tablet Take 600 mg by mouth at bedtime.     levothyroxine (SYNTHROID) 75 MCG tablet TAKE 1 TABLET DAILY 90 tablet 0   Nebivolol HCl 20 MG TABS TAKE 1 TABLET DAILY 90 tablet 0   omeprazole (PRILOSEC) 20 MG capsule TAKE 1 CAPSULE DAILY 90 capsule 0   methocarbamol (ROBAXIN) 500 MG tablet Take 1 tablet (500 mg total) by mouth every 8 (eight) hours as needed for muscle spasms. 30 tablet 2   nicotine polacrilex ([employer] NICOTINE) 2 MG gum Take 1 each (2 mg total) by mouth as needed for smoking cessation. 100 tablet 1   phenazopyridine (PYRIDIUM) 100 MG tablet Take 1 tablet (100 mg total) by mouth 3 (three) times daily as needed for pain. 10 tablet 0   No facility-administered medications prior to visit.    Allergies  Allergen Reactions   Cephalexin Palpitations   Levofloxacin Hives   Amoxicillin-Pot Clavulanate Nausea And Vomiting   Ketek [Telithromycin] Palpitations   Statins Rash     Review of Systems As per HPI.    Objective:    Physical Exam Vitals and nursing note reviewed.  Constitutional:      General: She is not in acute distress.    Appearance: She is not ill-appearing, toxic-appearing or diaphoretic.  Cardiovascular:     Rate and Rhythm: Normal rate.     Heart sounds: Normal heart sounds. No murmur heard. Pulmonary:     Effort: Pulmonary effort is normal. No respiratory distress.     Breath sounds: Normal breath sounds.  Abdominal:     General: Bowel sounds are normal. There is no distension.     Palpations: Abdomen is soft. There is no shifting dullness, fluid wave or mass.     Tenderness: There is abdominal tenderness in the left lower quadrant. There is no right CVA tenderness, left CVA tenderness, guarding or rebound. Negative signs include Murphy's sign, Rovsing's sign and McBurney's sign.     Hernia: No hernia is present.  Skin:    General: Skin is warm and dry.  Neurological:     General: No focal deficit present.     Mental Status: She is alert and oriented to person, place, and time.  Psychiatric:        Mood and Affect: Mood normal.        Behavior: Behavior normal.   Urine dipstick shows positive for RBC's.  Micro exam: 0 WBC's per HPF and 0-2 RBC's per HPF.   BP 129/82   Pulse 92   Temp 98.8 F (37.1 C)   Wt 129 lb 3.2 oz (58.6 kg)   BMI 24.41 kg/m  Wt Readings from Last 3 Encounters:  04/08/21 127 lb 6.4 oz (57.8 kg)  01/07/21 124 lb 9.6 oz (56.5 kg)  11/04/20 121 lb (54.9 kg)    Health Maintenance Due  Topic Date Due   Pneumococcal Vaccine 80-17 Years old (1 - PCV) Never done   Zoster Vaccines- Shingrix (1 of 2) Never done   COVID-19 Vaccine (3 - Booster for Moderna series) 02/28/2021    There are no preventive care reminders to display for this patient.   Lab Results  Component Value Date   TSH 0.107 (L) 01/07/2021   Lab Results  Component Value Date   WBC 8.5 01/07/2021   HGB 14.1 01/07/2021   HCT 40.9  01/07/2021   MCV 92 01/07/2021   PLT 237 01/07/2021   Lab Results  Component Value  Date   NA 139 01/07/2021   K 4.7 01/07/2021   CO2 21 01/07/2021   GLUCOSE 90 01/07/2021   BUN 5 (L) 01/07/2021   CREATININE 0.66 01/07/2021   BILITOT 0.4 01/07/2021   ALKPHOS 81 01/07/2021   AST 17 01/07/2021   ALT 13 01/07/2021   PROT 6.9 01/07/2021   ALBUMIN 4.5 01/07/2021   CALCIUM 9.8 01/07/2021   Lab Results  Component Value Date   CHOL 244 (H) 01/07/2021   Lab Results  Component Value Date   HDL 59 01/07/2021   Lab Results  Component Value Date   LDLCALC 164 (H) 01/07/2021   Lab Results  Component Value Date   TRIG 116 01/07/2021   Lab Results  Component Value Date   CHOLHDL 4.1 01/07/2021   No results found for: HGBA1C     Assessment & Plan:   Velia was seen today for abdominal pain.  Diagnoses and all orders for this visit:  Left lower quadrant abdominal pain UA negative. LLQ tenderness on exam. No guarding or rebound. Xray today in office, no significant findings, radiology report is pending. Will treat for possible diverticulosis as below. Discussed return precautions. Stay well hydrated.  -     Urinalysis, Routine w reflex microscopic -     DG Abd 1 View; Future -     metroNIDAZOLE (FLAGYL) 500 MG tablet; Take 1 tablet (500 mg total) by mouth 3 (three) times daily for 7 days. -     ciprofloxacin (CIPRO) 500 MG tablet; Take 1 tablet (500 mg total) by mouth 2 (two) times daily for 7 days.  Return to office for new or worsening symptoms, or if symptoms persist.   The patient indicates understanding of these issues and agrees with the plan.   Gabriel Earing, FNP

## 2021-07-13 ENCOUNTER — Telehealth: Payer: Self-pay | Admitting: Family Medicine

## 2021-08-05 NOTE — Telephone Encounter (Signed)
No call back will close encounter 

## 2021-08-13 ENCOUNTER — Ambulatory Visit: Payer: BC Managed Care – PPO | Admitting: Nurse Practitioner

## 2021-08-13 ENCOUNTER — Other Ambulatory Visit: Payer: Self-pay | Admitting: Family Medicine

## 2021-08-13 ENCOUNTER — Encounter: Payer: Self-pay | Admitting: Nurse Practitioner

## 2021-08-13 DIAGNOSIS — J069 Acute upper respiratory infection, unspecified: Secondary | ICD-10-CM | POA: Diagnosis not present

## 2021-08-13 DIAGNOSIS — E039 Hypothyroidism, unspecified: Secondary | ICD-10-CM

## 2021-08-13 MED ORDER — DOXYCYCLINE HYCLATE 100 MG PO TABS: 100.0000 mg | ORAL_TABLET | Freq: Two times a day (BID) | ORAL | 0 refills | Status: DC

## 2021-08-13 NOTE — Telephone Encounter (Signed)
Appointment scheduled.

## 2021-08-13 NOTE — Progress Notes (Addendum)
Virtual Visit  Note Due to COVID-19 pandemic this visit was conducted virtually. This visit type was conducted due to national recommendations for restrictions regarding the COVID-19 Pandemic (e.g. social distancing, sheltering in place) in an effort to limit this patient's exposure and mitigate transmission in our community. All issues noted in this document were discussed and addressed.  A physical exam was not performed with this format.  I connected with Candice Jackson on 08/13/21 at 11:19 by telephone and verified that I am speaking with the correct person using two identifiers. Candice Jackson is currently located at home and her husband is currently with her during visit. The provider, Mary-Margaret Daphine Deutscher, FNP is located in their office at time of visit.  I discussed the limitations, risks, security and privacy concerns of performing an evaluation and management service by telephone and the availability of in person appointments. I also discussed with the patient that there may be a patient responsible charge related to this service. The patient expressed understanding and agreed to proceed.   History and Present Illness:  Patient states she thinks she has a sinus infection. She says that she developed a headache and cough on Monday. The cough has made chest feel tight and she has developed som e ear pain. She did a covid test in saturday of last week and as negative. She did not develop symptoms until after her covid test.     Review of Systems  Constitutional:  Positive for malaise/fatigue. Negative for chills and fever.  HENT:  Positive for congestion. Negative for sore throat.   Respiratory:  Positive for cough and sputum production. Negative for shortness of breath.   Musculoskeletal:  Negative for myalgias.  Neurological:  Positive for headaches.    Observations/Objective: Alert and oriented- answers all questions appropriately No distress Voice hoarse Slight cough  noted during visit  Assessment and Plan: Candice Jackson in today with chief complaint of Sinusitis   1. URI with cough and congestion 1. Take meds as prescribed 2. Use a cool mist humidifier especially during the winter months and when heat has been humid. 3. Use saline nose sprays frequently 4. Saline irrigations of the nose can be very helpful if done frequently.  * 4X daily for 1 week*  * Use of a nettie pot can be helpful with this. Follow directions with this* 5. Drink plenty of fluids 6. Keep thermostat turn down low 7.For any cough or congestion  Use plain Mucinex- regular strength or max strength is fine   * Children- consult with Pharmacist for dosing 8. For fever or aces or pains- take tylenol or ibuprofen appropriate for age and weight.  * for fevers greater than 101 orally you may alternate ibuprofen and tylenol every  3 hours.   Patient will go get covid test and call back with reulsts  Patient called back with negative covid test. Meds ordered this encounter  Medications   doxycycline (VIBRA-TABS) 100 MG tablet    Sig: Take 1 tablet (100 mg total) by mouth 2 (two) times daily. 1 po bid    Dispense:  14 tablet    Refill:  0    Order Specific Question:   Supervising Provider    Answer:   Arville Care A [[id number]]      Follow Up Instructions: 12    I discussed the assessment and treatment plan with the patient. The patient was provided an opportunity to ask questions and all were answered. The patient agreed  with the plan and demonstrated an understanding of the instructions.   The patient was advised to call back or seek an in-person evaluation if the symptoms worsen or if the condition fails to improve as anticipated.  The above assessment and management plan was discussed with the patient. The patient verbalized understanding of and has agreed to the management plan. Patient is aware to call the clinic if symptoms persist or worsen. Patient is aware  when to return to the clinic for a follow-up visit. Patient educated on when it is appropriate to go to the emergency department.   Time call ended:  11:33  I provided 12 minutes of  non face-to-face time during this encounter.    Mary-Margaret Daphine Deutscher, FNP

## 2021-08-13 NOTE — Telephone Encounter (Signed)
Candice Jackson. NTBS rck thyroid from February. Mail order not sent

## 2021-08-13 NOTE — Addendum Note (Signed)
Addended by: Bennie Pierini on: 08/13/2021 12:31 PM   Modules accepted: Orders

## 2021-09-08 ENCOUNTER — Encounter: Payer: Self-pay | Admitting: Family Medicine

## 2021-09-08 ENCOUNTER — Ambulatory Visit: Payer: BC Managed Care – PPO | Admitting: Family Medicine

## 2021-09-08 DIAGNOSIS — K219 Gastro-esophageal reflux disease without esophagitis: Secondary | ICD-10-CM

## 2021-09-08 DIAGNOSIS — I10 Essential (primary) hypertension: Secondary | ICD-10-CM

## 2021-09-08 DIAGNOSIS — J441 Chronic obstructive pulmonary disease with (acute) exacerbation: Secondary | ICD-10-CM | POA: Diagnosis not present

## 2021-09-08 DIAGNOSIS — E782 Mixed hyperlipidemia: Secondary | ICD-10-CM

## 2021-09-08 DIAGNOSIS — E039 Hypothyroidism, unspecified: Secondary | ICD-10-CM

## 2021-09-08 MED ORDER — OMEPRAZOLE 20 MG PO CPDR: 20.0000 mg | DELAYED_RELEASE_CAPSULE | Freq: Every day | ORAL | 1 refills | Status: DC

## 2021-09-08 MED ORDER — NEBIVOLOL HCL 20 MG PO TABS: 1.0000 | ORAL_TABLET | Freq: Every day | ORAL | 1 refills | Status: DC

## 2021-09-08 MED ORDER — AZITHROMYCIN 250 MG PO TABS: ORAL_TABLET | ORAL | 0 refills | Status: DC

## 2021-09-08 MED ORDER — METHYLPREDNISOLONE 4 MG PO TBPK: ORAL_TABLET | ORAL | 0 refills | Status: DC

## 2021-09-08 NOTE — Progress Notes (Signed)
Virtual Visit via Telephone Note  I connected with Candice Jackson on 09/08/21 at 8:18 AM by telephone and verified that I am speaking with the correct person using two identifiers. Candice Jackson is currently located at home and nobody is currently with her during this visit. The provider, Loman Brooklyn, FNP is located in their office at time of visit.  I discussed the limitations, risks, security and privacy concerns of performing an evaluation and management service by telephone and the availability of in person appointments. I also discussed with the patient that there may be a patient responsible charge related to this service. The patient expressed understanding and agreed to proceed.  Subjective: PCP: Loman Brooklyn, FNP  Chief Complaint  Patient presents with   Medical Management of Chronic Issues   Hypothyroidism: Patient's dose of levothyroxine was decreased from 88 mcg to 75 mcg eight months ago. She did not return for repeat labs in 6-8 weeks.    COPD: Patient uses Symbicort twice daily every day.  She rarely has to use her albuterol inhaler.  Hypertension: taking nebivolol.    Hyperlipidemia: Patient is a current every day smoker, but states she has cut down.   The 10-year ASCVD risk score (Arnett DK, et al., 2019) is: 8.5%   Values used to calculate the score:     Age: 55 years     Sex: Female     Is Non-Hispanic African American: No     Diabetic: No     Tobacco smoker: Yes     Systolic Blood Pressure: 778 mmHg     Is BP treated: Yes     HDL Cholesterol: 59 mg/dL     Total Cholesterol: 244 mg/dL  Hypomagnesemia: patient did not initiate a magnesium supplement after her last labs resulted. Reports her leg cramps have resolved.  Patient complains of cough, head/chest congestion, headache, runny nose, sneezing, sore throat, facial pain/pressure, postnasal drainage, and wheezing. Onset of symptoms was 1 week ago, gradually worsening since that time. She is  drinking plenty of fluids. Evaluation to date: none. Treatment to date: cough suppressants and Ibuprofen . She has a history of COPD. She does smoke. Patient has been fully vaccinated against COVID-19.   ROS: Per HPI  Current Outpatient Medications:    albuterol (PROAIR HFA) 108 (90 Base) MCG/ACT inhaler, Inhale 2 puffs into the lungs every 6 (six) hours as needed., Disp: 18 g, Rfl: 11   budesonide-formoterol (SYMBICORT) 160-4.5 MCG/ACT inhaler, Inhale 2 puffs into the lungs 2 (two) times daily., Disp: 1 Inhaler, Rfl: 3   cyclobenzaprine (FLEXERIL) 5 MG tablet, Take 1 tablet (5 mg total) by mouth 3 (three) times daily as needed for muscle spasms., Disp: 90 tablet, Rfl: 1   fluticasone (FLONASE) 50 MCG/ACT nasal spray, USE 2 SPRAYS IN EACH NOSTRIL DAILY, Disp: 48 g, Rfl: 0   ibuprofen (ADVIL,MOTRIN) 600 MG tablet, Take 600 mg by mouth at bedtime., Disp: , Rfl:    levothyroxine (SYNTHROID) 75 MCG tablet, TAKE 1 TABLET DAILY, Disp: 90 tablet, Rfl: 0   Nebivolol HCl 20 MG TABS, TAKE 1 TABLET DAILY, Disp: 90 tablet, Rfl: 0   omeprazole (PRILOSEC) 20 MG capsule, TAKE 1 CAPSULE DAILY, Disp: 90 capsule, Rfl: 0  Allergies  Allergen Reactions   Cephalexin Palpitations   Levofloxacin Hives   Amoxicillin-Pot Clavulanate Nausea And Vomiting   Ketek [Telithromycin] Palpitations   Statins Rash   Past Medical History:  Diagnosis Date   Allergic rhinitis  Allergy    Chronic bronchitis (HCC)    Constipation    GERD (gastroesophageal reflux disease)    Hemorrhoid    HTN (hypertension)    Hyperlipidemia    Hypomagnesemia 01/08/2021   Thyroid disease     Observations/Objective: A&O  No respiratory distress or wheezing audible over the phone Mood, judgement, and thought processes all WNL  Assessment and Plan: 1. COPD exacerbation (HCC) - methylPREDNISolone (MEDROL DOSEPAK) 4 MG TBPK tablet; Use as directed.  Dispense: 21 each; Refill: 0 - azithromycin (ZITHROMAX Z-PAK) 250 MG tablet; Take 2  tablets (500 mg) PO today, then 1 tablet (250 mg) PO daily x4 days.  Dispense: 6 tablet; Refill: 0  2. Essential (primary) hypertension - Nebivolol HCl 20 MG TABS; Take 1 tablet (20 mg total) by mouth daily.  Dispense: 90 tablet; Refill: 1 - CBC with Differential/Platelet; Future - CMP14+EGFR; Future - Lipid panel; Future  3. Acquired hypothyroidism Needs labs to assess after dose change. - TSH; Future - T4, free; Future  4. Mixed hyperlipidemia - Lipid panel; Future  5. Hypomagnesemia - Magnesium; Future  6. Gastroesophageal reflux disease without esophagitis Well controlled on current regimen.  - omeprazole (PRILOSEC) 20 MG capsule; Take 1 capsule (20 mg total) by mouth daily.  Dispense: 90 capsule; Refill: 1 - CMP14+EGFR; Future  Patient to come for lab work when she is feeling better as she is current sick.  Follow Up Instructions: Return in about 3 months (around 12/09/2021) for annual physical.  I discussed the assessment and treatment plan with the patient. The patient was provided an opportunity to ask questions and all were answered. The patient agreed with the plan and demonstrated an understanding of the instructions.   The patient was advised to call back or seek an in-person evaluation if the symptoms worsen or if the condition fails to improve as anticipated.  The above assessment and management plan was discussed with the patient. The patient verbalized understanding of and has agreed to the management plan. Patient is aware to call the clinic if symptoms persist or worsen. Patient is aware when to return to the clinic for a follow-up visit. Patient educated on when it is appropriate to go to the emergency department.   Time call ended: 8:33 AM  I provided 15 minutes of non-face-to-face time during this encounter.  Hendricks Limes, MSN, APRN, FNP-C [facility] 09/08/21

## 2021-09-14 ENCOUNTER — Other Ambulatory Visit: Payer: Self-pay

## 2021-09-14 ENCOUNTER — Other Ambulatory Visit: Payer: BC Managed Care – PPO

## 2021-09-14 DIAGNOSIS — I10 Essential (primary) hypertension: Secondary | ICD-10-CM

## 2021-09-14 DIAGNOSIS — E039 Hypothyroidism, unspecified: Secondary | ICD-10-CM

## 2021-09-14 DIAGNOSIS — E782 Mixed hyperlipidemia: Secondary | ICD-10-CM

## 2021-09-14 DIAGNOSIS — K219 Gastro-esophageal reflux disease without esophagitis: Secondary | ICD-10-CM

## 2021-09-15 LAB — CBC WITH DIFFERENTIAL/PLATELET
Basophils Absolute: 0.1 10*3/uL (ref 0.0–0.2)
Basos: 2 %
EOS (ABSOLUTE): 0.2 10*3/uL (ref 0.0–0.4)
Eos: 4 %
Hematocrit: 41.6 % (ref 34.0–46.6)
Hemoglobin: 14.3 g/dL (ref 11.1–15.9)
Immature Grans (Abs): 0 10*3/uL (ref 0.0–0.1)
Immature Granulocytes: 0 %
Lymphocytes Absolute: 3.2 10*3/uL — ABNORMAL HIGH (ref 0.7–3.1)
Lymphs: 53 %
MCH: 31.6 pg (ref 26.6–33.0)
MCHC: 34.4 g/dL (ref 31.5–35.7)
MCV: 92 fL (ref 79–97)
Monocytes Absolute: 0.4 10*3/uL (ref 0.1–0.9)
Monocytes: 6 %
Neutrophils Absolute: 2.1 10*3/uL (ref 1.4–7.0)
Neutrophils: 35 %
Platelets: 236 10*3/uL (ref 150–450)
RBC: 4.53 x10E6/uL (ref 3.77–5.28)
RDW: 12.7 % (ref 11.7–15.4)
WBC: 6 10*3/uL (ref 3.4–10.8)

## 2021-09-15 LAB — CMP14+EGFR
ALT: 22 IU/L (ref 0–32)
AST: 29 IU/L (ref 0–40)
Albumin/Globulin Ratio: 1.6 (ref 1.2–2.2)
Albumin: 4.3 g/dL (ref 3.8–4.9)
Alkaline Phosphatase: 81 IU/L (ref 44–121)
BUN/Creatinine Ratio: 8 — ABNORMAL LOW (ref 9–23)
BUN: 6 mg/dL (ref 6–24)
Bilirubin Total: 0.3 mg/dL (ref 0.0–1.2)
CO2: 17 mmol/L — ABNORMAL LOW (ref 20–29)
Calcium: 8.9 mg/dL (ref 8.7–10.2)
Chloride: 101 mmol/L (ref 96–106)
Creatinine, Ser: 0.8 mg/dL (ref 0.57–1.00)
Globulin, Total: 2.7 g/dL (ref 1.5–4.5)
Glucose: 99 mg/dL (ref 70–99)
Potassium: 5.5 mmol/L — ABNORMAL HIGH (ref 3.5–5.2)
Sodium: 138 mmol/L (ref 134–144)
Total Protein: 7 g/dL (ref 6.0–8.5)
eGFR: 86 mL/min/{1.73_m2} (ref >59)

## 2021-09-15 LAB — LIPID PANEL
Chol/HDL Ratio: 6.4 ratio — ABNORMAL HIGH (ref 0.0–4.4)
Cholesterol, Total: 287 mg/dL — ABNORMAL HIGH (ref 100–199)
HDL: 45 mg/dL (ref >39)
LDL Chol Calc (NIH): 156 mg/dL — ABNORMAL HIGH (ref 0–99)
Triglycerides: 453 mg/dL — ABNORMAL HIGH (ref 0–149)
VLDL Cholesterol Cal: 86 mg/dL — ABNORMAL HIGH (ref 5–40)

## 2021-09-15 LAB — MAGNESIUM: Magnesium: 1.7 mg/dL (ref 1.6–2.3)

## 2021-09-15 LAB — T4, FREE: Free T4: 1.12 ng/dL (ref 0.82–1.77)

## 2021-09-15 LAB — TSH: TSH: 5.2 u[IU]/mL — ABNORMAL HIGH (ref 0.450–4.500)

## 2021-09-16 ENCOUNTER — Other Ambulatory Visit: Payer: Self-pay | Admitting: Family Medicine

## 2021-09-16 DIAGNOSIS — E039 Hypothyroidism, unspecified: Secondary | ICD-10-CM

## 2021-09-16 MED ORDER — LEVOTHYROXINE SODIUM 75 MCG PO TABS: ORAL_TABLET | ORAL | 0 refills | Status: DC

## 2021-09-21 MED ORDER — EZETIMIBE 10 MG PO TABS: 10.0000 mg | ORAL_TABLET | Freq: Every day | ORAL | 1 refills | Status: DC

## 2021-09-30 ENCOUNTER — Telehealth: Payer: BC Managed Care – PPO | Admitting: Family Medicine

## 2021-09-30 ENCOUNTER — Encounter: Payer: Self-pay | Admitting: Family Medicine

## 2021-09-30 DIAGNOSIS — R051 Acute cough: Secondary | ICD-10-CM

## 2021-09-30 DIAGNOSIS — J069 Acute upper respiratory infection, unspecified: Secondary | ICD-10-CM

## 2021-09-30 LAB — VERITOR FLU A/B WAIVED
Influenza A: NEGATIVE
Influenza B: NEGATIVE

## 2021-09-30 NOTE — Addendum Note (Signed)
Addended by: Cassell Clement on: 09/30/2021 10:08 AM   Modules accepted: Orders

## 2021-09-30 NOTE — Progress Notes (Signed)
Virtual Visit via Video note  I connected with Candice Jackson on 09/30/21 at 9:00 AM by video and verified that I am speaking with the correct person using two identifiers. Candice Jackson is currently located at home and nobody is currently with her during visit. The provider, Gwenlyn Fudge, FNP is located in their office at time of visit.  I discussed the limitations, risks, security and privacy concerns of performing an evaluation and management service by video and the availability of in person appointments. I also discussed with the patient that there may be a patient responsible charge related to this service. The patient expressed understanding and agreed to proceed.  Subjective: PCP: Gwenlyn Fudge, FNP  Chief Complaint  Patient presents with   Cough   Patient complains of cough, chest congestion, headache, runny nose, sneezing, sore throat, postnasal drainage, and wheezing. Onset of symptoms was 4 days ago, gradually worsening since that time. She is drinking plenty of fluids. Evaluation to date: none. Treatment to date: cough suppressants. She has a history of COPD. She does smoke. Patient has been fully vaccinated against COVID-19.   ROS: Per HPI  Current Outpatient Medications:    albuterol (PROAIR HFA) 108 (90 Base) MCG/ACT inhaler, Inhale 2 puffs into the lungs every 6 (six) hours as needed., Disp: 18 g, Rfl: 11   azithromycin (ZITHROMAX Z-PAK) 250 MG tablet, Take 2 tablets (500 mg) PO today, then 1 tablet (250 mg) PO daily x4 days., Disp: 6 tablet, Rfl: 0   budesonide-formoterol (SYMBICORT) 160-4.5 MCG/ACT inhaler, Inhale 2 puffs into the lungs 2 (two) times daily., Disp: 1 Inhaler, Rfl: 3   cyclobenzaprine (FLEXERIL) 5 MG tablet, Take 1 tablet (5 mg total) by mouth 3 (three) times daily as needed for muscle spasms., Disp: 90 tablet, Rfl: 1   ezetimibe (ZETIA) 10 MG tablet, Take 1 tablet (10 mg total) by mouth daily., Disp: 90 tablet, Rfl: 1   fluticasone  (FLONASE) 50 MCG/ACT nasal spray, USE 2 SPRAYS IN EACH NOSTRIL DAILY, Disp: 48 g, Rfl: 0   ibuprofen (ADVIL,MOTRIN) 600 MG tablet, Take 600 mg by mouth at bedtime., Disp: , Rfl:    levothyroxine (SYNTHROID) 75 MCG tablet, Take 1 tablet (75 mcg) by mouth daily except one day per week take 1.5 tablets (112.5 mcg)., Disp: 90 tablet, Rfl: 0   methylPREDNISolone (MEDROL DOSEPAK) 4 MG TBPK tablet, Use as directed., Disp: 21 each, Rfl: 0   Nebivolol HCl 20 MG TABS, Take 1 tablet (20 mg total) by mouth daily., Disp: 90 tablet, Rfl: 1   omeprazole (PRILOSEC) 20 MG capsule, Take 1 capsule (20 mg total) by mouth daily., Disp: 90 capsule, Rfl: 1  Allergies  Allergen Reactions   Cephalexin Palpitations   Levofloxacin Hives   Amoxicillin-Pot Clavulanate Nausea And Vomiting   Ketek [Telithromycin] Palpitations   Statins Rash   Past Medical History:  Diagnosis Date   Allergic rhinitis    Allergy    Chronic bronchitis (HCC)    Constipation    GERD (gastroesophageal reflux disease)    Hemorrhoid    HTN (hypertension)    Hyperlipidemia    Hypomagnesemia 01/08/2021   Thyroid disease     Observations/Objective: Physical Exam Constitutional:      General: She is not in acute distress.    Appearance: Normal appearance. She is not ill-appearing or toxic-appearing.  Eyes:     General: No scleral icterus.       Right eye: No discharge.  Left eye: No discharge.     Conjunctiva/sclera: Conjunctivae normal.  Pulmonary:     Effort: Pulmonary effort is normal. No respiratory distress.  Neurological:     Mental Status: She is alert and oriented to person, place, and time.  Psychiatric:        Mood and Affect: Mood normal.        Behavior: Behavior normal.        Thought Content: Thought content normal.        Judgment: Judgment normal.   Assessment and Plan: 1. Viral URI Discussed symptom management.  2. Acute cough - Novel Coronavirus, NAA ([facility]); Future - Veritor Flu A/B Waived;  Future   Follow Up Instructions:   I discussed the assessment and treatment plan with the patient. The patient was provided an opportunity to ask questions and all were answered. The patient agreed with the plan and demonstrated an understanding of the instructions.   The patient was advised to call back or seek an in-person evaluation if the symptoms worsen or if the condition fails to improve as anticipated.  The above assessment and management plan was discussed with the patient. The patient verbalized understanding of and has agreed to the management plan. Patient is aware to call the clinic if symptoms persist or worsen. Patient is aware when to return to the clinic for a follow-up visit. Patient educated on when it is appropriate to go to the emergency department.   Time call ended: 9:08 AM  I provided 8 minutes of face-to-face time during this encounter.   Deliah Boston, MSN, APRN, FNP-C [facility] 09/30/21

## 2021-10-01 LAB — SARS-COV-2, NAA 2 DAY TAT

## 2021-10-01 LAB — NOVEL CORONAVIRUS, NAA: SARS-CoV-2, NAA: NOT DETECTED

## 2021-10-02 ENCOUNTER — Telehealth: Payer: Self-pay | Admitting: Family Medicine

## 2021-10-02 NOTE — Telephone Encounter (Signed)
Patient aware of results.

## 2021-10-07 ENCOUNTER — Other Ambulatory Visit: Payer: Self-pay | Admitting: Family Medicine

## 2021-10-07 DIAGNOSIS — J019 Acute sinusitis, unspecified: Secondary | ICD-10-CM

## 2021-10-07 DIAGNOSIS — B9689 Other specified bacterial agents as the cause of diseases classified elsewhere: Secondary | ICD-10-CM

## 2021-12-09 ENCOUNTER — Encounter: Payer: Self-pay | Admitting: Family Medicine

## 2021-12-09 ENCOUNTER — Ambulatory Visit (INDEPENDENT_AMBULATORY_CARE_PROVIDER_SITE_OTHER): Payer: BC Managed Care – PPO | Admitting: Family Medicine

## 2021-12-09 VITALS — BP 143/96 | HR 73 | Temp 97.4°F | Ht 61.0 in | Wt 127.8 lb

## 2021-12-09 DIAGNOSIS — E782 Mixed hyperlipidemia: Secondary | ICD-10-CM | POA: Diagnosis not present

## 2021-12-09 DIAGNOSIS — E039 Hypothyroidism, unspecified: Secondary | ICD-10-CM

## 2021-12-09 DIAGNOSIS — I10 Essential (primary) hypertension: Secondary | ICD-10-CM | POA: Diagnosis not present

## 2021-12-09 DIAGNOSIS — F411 Generalized anxiety disorder: Secondary | ICD-10-CM

## 2021-12-09 DIAGNOSIS — Z0000 Encounter for general adult medical examination without abnormal findings: Secondary | ICD-10-CM

## 2021-12-09 DIAGNOSIS — Z0001 Encounter for general adult medical examination with abnormal findings: Secondary | ICD-10-CM

## 2021-12-09 DIAGNOSIS — Z720 Tobacco use: Secondary | ICD-10-CM | POA: Insufficient documentation

## 2021-12-09 DIAGNOSIS — J449 Chronic obstructive pulmonary disease, unspecified: Secondary | ICD-10-CM

## 2021-12-09 DIAGNOSIS — K219 Gastro-esophageal reflux disease without esophagitis: Secondary | ICD-10-CM

## 2021-12-09 MED ORDER — LEVOTHYROXINE SODIUM 75 MCG PO TABS: ORAL_TABLET | ORAL | 1 refills | Status: DC

## 2021-12-09 MED ORDER — CYCLOBENZAPRINE HCL 5 MG PO TABS: 5.0000 mg | ORAL_TABLET | Freq: Three times a day (TID) | ORAL | 1 refills | Status: AC | PRN

## 2021-12-09 NOTE — Progress Notes (Signed)
Assessment & Plan:  1. Well adult exam - printed wellness information provided - CBC with Differential/Platelet - CMP14+EGFR - Lipid panel  2. Acquired hypothyroidism - improving, labs to assess - levothyroxine (SYNTHROID) 75 MCG tablet; Take 1 tablet (75 mcg) by mouth daily except one day per week take 1.5 tablets (112.5 mcg).  Dispense: 90 tablet; Refill: 1 - TSH - T4, free  3. Essential (primary) hypertension - encouraged to take BP at home and keep a log - CBC with Differential/Platelet - CMP14+EGFR - Lipid panel  4. Mixed hyperlipidemia - encouraged healthy diet and exercise - CMP14+EGFR - Lipid panel  5. Gastroesophageal reflux disease without esophagitis - Well controlled on current regimen - CMP14+EGFR  6. GAD (generalized anxiety disorder) - Well controlled, not currently on medication - CMP14+EGFR  7. COPD without exacerbation ([place]) - cough present, recent COVID-19 may be exacerbating cough - Well controlled on current regimen with minimal use of albuterol  8. Tobacco use - encouraged to consider cessation   Follow-up: Return in about 3 months (around 03/09/2022) for follow-up of chronic medication conditions.   Lucile Crater, NP Student  I personally was present during the history, physical exam, and medical decision-making activities of this service and have verified that the service and findings are accurately documented in the nurse practitioner student's note.  Hendricks Limes, MSN, APRN, FNP-C [facility]  Subjective:  Patient ID: Candice Jackson, female    DOB: 30-Dec-1965  Age: 56 y.o. MRN: [id number]  Patient Care Team: Loman Brooklyn, FNP as PCP - General (Family Medicine)   CC:  Chief Complaint  Patient presents with   Annual Exam    HPI Candice Jackson presents for annual physical and management of chronic medical conditions.  Hypertension: Previously controlled with Nebivolol 20 mg daily, but she is high  today. She states at home she is normally under 782 systolic. She states she has been more stressed lately since her brother recently passed.   Hypothyroidism: taking levothyroxine daily. Dose was increased to 75 mcg daily with 112.5 mcg on Wednesdays after her lab work in October.   COPD: controlled with daily Symbicort and albuterol as needed. Patient reports she rarely requires her Albuterol inhaler.  GERD: controlled with omeprazole.  Hyperlipidemia: taking Zetia daily, unable to tolerate statins.   The 10-year ASCVD risk score (Arnett DK, et al., 2019) is: 14.9%   Values used to calculate the score:     Age: 56 years     Sex: Female     Is Non-Hispanic African American: No     Diabetic: No     Tobacco smoker: Yes     Systolic Blood Pressure: 956 mmHg     Is BP treated: Yes     HDL Cholesterol: 45 mg/dL     Total Cholesterol: 287 mg/dL  Health Maintenance: Occupation: Chartered certified accountant, Marital status: married, Substance use: 3-4 beers per night, no drug use. Diet: regular, tries to eat healthy, Exercise: none, due to pain in legs with activity Last eye exam: 2022, [place] Last dental exam: [place], 2022 Last colonoscopy: cologuard 01/31/20, negative Last mammogram: 05/11/21 Last pap smear: s/p total hysterectomy Hepatitis C Screening: completed Immunizations: Flu Vaccine: declined Tdap Vaccine: declined  Shingrix Vaccine: declined  COVID-19 Vaccine: declined Pneumonia Vaccine: declined   DEPRESSION SCREENING Depression screen [facility] 2/9 12/09/2021 07/09/2021 01/07/2021  Decreased Interest 0 0 0  Down, Depressed, Hopeless 0 0 0  PHQ - 2 Score 0  0 0  Altered sleeping 0 0 -  Tired, decreased energy 3 1 -  Change in appetite 0 0 -  Feeling bad or failure about yourself  0 0 -  Trouble concentrating 0 0 -  Moving slowly or fidgety/restless 0 0 -  Suicidal thoughts 0 0 -  PHQ-9 Score 3 1 -  Difficult doing work/chores Somewhat difficult Not difficult at all -   GAD 7  : Generalized Anxiety Score 12/09/2021 07/09/2021  Nervous, Anxious, on Edge 0 1  Control/stop worrying 0 0  Worry too much - different things 0 1  Trouble relaxing 0 0  Restless 0 0  Easily annoyed or irritable 0 0  Afraid - awful might happen 0 0  Total GAD 7 Score 0 2  Anxiety Difficulty Not difficult at all Not difficult at all    Review of Systems  Constitutional: Negative.  Negative for chills, fever, malaise/fatigue and weight loss.  HENT: Negative.  Negative for congestion, ear discharge, ear pain, hearing loss and sinus pain.   Eyes: Negative.  Negative for blurred vision, pain, discharge and redness.  Respiratory:  Positive for cough (constant cough). Negative for shortness of breath and wheezing.   Cardiovascular:  Positive for leg swelling (right leg, states it has been swollen since breaking her leg several years ago). Negative for chest pain, palpitations and orthopnea.  Gastrointestinal: Negative.  Negative for abdominal pain, constipation, diarrhea, heartburn, nausea and vomiting.  Genitourinary: Negative.  Negative for dysuria, frequency and urgency.  Musculoskeletal:  Positive for myalgias (leg pain). Negative for back pain, falls, joint pain and neck pain.  Skin: Negative.  Negative for itching and rash.  Neurological:  Positive for sensory change (numbess in fingers when its cold). Negative for dizziness, tremors, weakness and headaches.  Endo/Heme/Allergies: Negative.  Negative for environmental allergies and polydipsia. Does not bruise/bleed easily.  Psychiatric/Behavioral: Negative.  Negative for depression, memory loss and substance abuse. The patient is not nervous/anxious and does not have insomnia.     Current Outpatient Medications:    albuterol (PROAIR HFA) 108 (90 Base) MCG/ACT inhaler, Inhale 2 puffs into the lungs every 6 (six) hours as needed., Disp: 18 g, Rfl: 11   budesonide-formoterol (SYMBICORT) 160-4.5 MCG/ACT inhaler, Inhale 2 puffs into the lungs 2  (two) times daily., Disp: 1 Inhaler, Rfl: 3   ezetimibe (ZETIA) 10 MG tablet, Take 1 tablet (10 mg total) by mouth daily., Disp: 90 tablet, Rfl: 1   fluticasone (FLONASE) 50 MCG/ACT nasal spray, USE 2 SPRAYS IN EACH NOSTRIL DAILY, Disp: 48 g, Rfl: 1   ibuprofen (ADVIL,MOTRIN) 600 MG tablet, Take 600 mg by mouth at bedtime., Disp: , Rfl:    Nebivolol HCl 20 MG TABS, Take 1 tablet (20 mg total) by mouth daily., Disp: 90 tablet, Rfl: 1   omeprazole (PRILOSEC) 20 MG capsule, Take 1 capsule (20 mg total) by mouth daily., Disp: 90 capsule, Rfl: 1   cyclobenzaprine (FLEXERIL) 5 MG tablet, Take 1 tablet (5 mg total) by mouth 3 (three) times daily as needed for muscle spasms., Disp: 90 tablet, Rfl: 1   levothyroxine (SYNTHROID) 75 MCG tablet, Take 1 tablet (75 mcg) by mouth daily except one day per week take 1.5 tablets (112.5 mcg)., Disp: 90 tablet, Rfl: 1  Allergies  Allergen Reactions   Cephalexin Palpitations   Levofloxacin Hives   Amoxicillin-Pot Clavulanate Nausea And Vomiting   Doxycycline Nausea And Vomiting   Ketek [Telithromycin] Palpitations   Statins Rash    Past Medical  History:  Diagnosis Date   Allergic rhinitis    Allergy    Chronic bronchitis (HCC)    Constipation    GERD (gastroesophageal reflux disease)    Hemorrhoid    HTN (hypertension)    Hyperlipidemia    Hypomagnesemia 01/08/2021   Thyroid disease     Past Surgical History:  Procedure Laterality Date   HEMORROIDECTOMY     R ankle surgery     TOTAL ABDOMINAL HYSTERECTOMY      Family History  Problem Relation Age of Onset   Heart disease Father    Stroke Father    Cancer Mother        brain tumor   Hypertension Sister    Arthritis Sister    Heart attack Brother    Hypertension Brother    Hypertension Daughter    Gout Son    Diabetes Brother    Hypertension Brother    Arthritis Brother    Hypertension Brother     Social History   Socioeconomic History   Marital status: Married    Spouse name:  Doctor, general practice   Number of children: 2   Years of education: Not on file   Highest education level: Not on file  Occupational History   Occupation: Therapist, music: [employer]  Tobacco Use   Smoking status: Every Day    Packs/day: 0.80    Years: 20.00    Pack years: 16.00    Types: Cigarettes   Smokeless tobacco: Never  Vaping Use   Vaping Use: Never used  Substance and Sexual Activity   Alcohol use: Yes    Comment: 3-4 beers a night   Drug use: Never   Sexual activity: Not on file  Other Topics Concern   Not on file  Social History Narrative   Not on file   Social Determinants of Health   Financial Resource Strain: Not on file  Food Insecurity: Not on file  Transportation Needs: Not on file  Physical Activity: Not on file  Stress: Not on file  Social Connections: Not on file  Intimate Partner Violence: Not on file      Objective:    BP (!) 143/96    Pulse 73    Temp (!) 97.4 F (36.3 C) (Temporal)    Ht 5' 1"  (1.549 m)    Wt 58 kg    SpO2 96%    BMI 24.15 kg/m   Wt Readings from Last 3 Encounters:  12/09/21 127 lb 12.8 oz (58 kg)  07/09/21 129 lb 3.2 oz (58.6 kg)  04/08/21 127 lb 6.4 oz (57.8 kg)    Physical Exam Vitals reviewed.  Constitutional:      General: She is not in acute distress.    Appearance: Normal appearance. She is normal weight. She is not ill-appearing, toxic-appearing or diaphoretic.  HENT:     Head: Normocephalic and atraumatic.     Right Ear: Tympanic membrane, ear canal and external ear normal.     Left Ear: Tympanic membrane, ear canal and external ear normal.     Nose: Nose normal. No congestion.     Mouth/Throat:     Mouth: Mucous membranes are moist.     Pharynx: Oropharynx is clear. No oropharyngeal exudate or posterior oropharyngeal erythema.  Eyes:     Extraocular Movements: Extraocular movements intact.     Conjunctiva/sclera: Conjunctivae normal.     Pupils: Pupils are equal, round, and reactive to light.   Cardiovascular:  Rate and Rhythm: Normal rate and regular rhythm.     Pulses: Normal pulses.     Heart sounds: Normal heart sounds.  Pulmonary:     Effort: Pulmonary effort is normal. No respiratory distress.     Breath sounds: Normal breath sounds. No wheezing or rhonchi.  Abdominal:     General: Bowel sounds are normal. There is no distension.     Palpations: Abdomen is soft. There is no mass.  Musculoskeletal:        General: Normal range of motion.     Cervical back: Normal range of motion.     Right lower leg: Edema present.  Skin:    General: Skin is warm and dry.     Capillary Refill: Capillary refill takes 2 to 3 seconds.  Neurological:     General: No focal deficit present.     Mental Status: She is alert and oriented to person, place, and time. Mental status is at baseline.     Motor: No weakness.     Gait: Gait normal.  Psychiatric:        Mood and Affect: Mood normal.        Behavior: Behavior normal.        Thought Content: Thought content normal.        Judgment: Judgment normal.    Lab Results  Component Value Date   TSH 5.200 (H) 09/14/2021   Lab Results  Component Value Date   WBC 6.0 09/14/2021   HGB 14.3 09/14/2021   HCT 41.6 09/14/2021   MCV 92 09/14/2021   PLT 236 09/14/2021   Lab Results  Component Value Date   NA 138 09/14/2021   K 5.5 (H) 09/14/2021   CO2 17 (L) 09/14/2021   GLUCOSE 99 09/14/2021   BUN 6 09/14/2021   CREATININE 0.80 09/14/2021   BILITOT 0.3 09/14/2021   ALKPHOS 81 09/14/2021   AST 29 09/14/2021   ALT 22 09/14/2021   PROT 7.0 09/14/2021   ALBUMIN 4.3 09/14/2021   CALCIUM 8.9 09/14/2021   EGFR 86 09/14/2021   Lab Results  Component Value Date   CHOL 287 (H) 09/14/2021   Lab Results  Component Value Date   HDL 45 09/14/2021   Lab Results  Component Value Date   LDLCALC 156 (H) 09/14/2021   Lab Results  Component Value Date   TRIG 453 (H) 09/14/2021   Lab Results  Component Value Date   CHOLHDL 6.4  (H) 09/14/2021   No results found for: HGBA1C

## 2021-12-10 LAB — CBC WITH DIFFERENTIAL/PLATELET
Basophils Absolute: 0.1 10*3/uL (ref 0.0–0.2)
Basos: 2 %
EOS (ABSOLUTE): 0.3 10*3/uL (ref 0.0–0.4)
Eos: 4 %
Hematocrit: 39.2 % (ref 34.0–46.6)
Hemoglobin: 14.1 g/dL (ref 11.1–15.9)
Immature Grans (Abs): 0 10*3/uL (ref 0.0–0.1)
Immature Granulocytes: 0 %
Lymphocytes Absolute: 2.9 10*3/uL (ref 0.7–3.1)
Lymphs: 49 %
MCH: 32.5 pg (ref 26.6–33.0)
MCHC: 36 g/dL — ABNORMAL HIGH (ref 31.5–35.7)
MCV: 90 fL (ref 79–97)
Monocytes Absolute: 0.5 10*3/uL (ref 0.1–0.9)
Monocytes: 8 %
Neutrophils Absolute: 2.2 10*3/uL (ref 1.4–7.0)
Neutrophils: 37 %
Platelets: 220 10*3/uL (ref 150–450)
RBC: 4.34 x10E6/uL (ref 3.77–5.28)
RDW: 12.2 % (ref 11.7–15.4)
WBC: 5.9 10*3/uL (ref 3.4–10.8)

## 2021-12-10 LAB — CMP14+EGFR
ALT: 20 IU/L (ref 0–32)
AST: 22 IU/L (ref 0–40)
Albumin/Globulin Ratio: 1.8 (ref 1.2–2.2)
Albumin: 4.5 g/dL (ref 3.8–4.9)
Alkaline Phosphatase: 80 IU/L (ref 44–121)
BUN/Creatinine Ratio: 8 — ABNORMAL LOW (ref 9–23)
BUN: 5 mg/dL — ABNORMAL LOW (ref 6–24)
Bilirubin Total: 0.3 mg/dL (ref 0.0–1.2)
CO2: 22 mmol/L (ref 20–29)
Calcium: 9.2 mg/dL (ref 8.7–10.2)
Chloride: 100 mmol/L (ref 96–106)
Creatinine, Ser: 0.65 mg/dL (ref 0.57–1.00)
Globulin, Total: 2.5 g/dL (ref 1.5–4.5)
Glucose: 82 mg/dL (ref 70–99)
Potassium: 4.2 mmol/L (ref 3.5–5.2)
Sodium: 136 mmol/L (ref 134–144)
Total Protein: 7 g/dL (ref 6.0–8.5)
eGFR: 103 mL/min/{1.73_m2} (ref >59)

## 2021-12-10 LAB — TSH: TSH: 2.57 u[IU]/mL (ref 0.450–4.500)

## 2021-12-10 LAB — LIPID PANEL
Chol/HDL Ratio: 6.2 ratio — ABNORMAL HIGH (ref 0.0–4.4)
Cholesterol, Total: 273 mg/dL — ABNORMAL HIGH (ref 100–199)
HDL: 44 mg/dL (ref >39)
LDL Chol Calc (NIH): 161 mg/dL — ABNORMAL HIGH (ref 0–99)
Triglycerides: 359 mg/dL — ABNORMAL HIGH (ref 0–149)
VLDL Cholesterol Cal: 68 mg/dL — ABNORMAL HIGH (ref 5–40)

## 2021-12-10 LAB — T4, FREE: Free T4: 1.31 ng/dL (ref 0.82–1.77)

## 2021-12-15 ENCOUNTER — Other Ambulatory Visit: Payer: Self-pay | Admitting: Family Medicine

## 2021-12-15 DIAGNOSIS — E782 Mixed hyperlipidemia: Secondary | ICD-10-CM

## 2021-12-15 DIAGNOSIS — Z789 Other specified health status: Secondary | ICD-10-CM

## 2021-12-15 DIAGNOSIS — I700 Atherosclerosis of aorta: Secondary | ICD-10-CM

## 2021-12-15 MED ORDER — BEMPEDOIC ACID 180 MG PO TABS: 180.0000 mg | ORAL_TABLET | Freq: Every day | ORAL | 2 refills | Status: AC

## 2021-12-18 NOTE — Telephone Encounter (Signed)
No eligibility found for pt - on CMM  New insurance card given 11/2021 is what I am using for this   Covered by plan alternatives:  Generic Crestor and generic lipitor

## 2021-12-21 NOTE — Telephone Encounter (Signed)
(  Key: [id number]) Nexletol 180MG  tablets  Sent to plan

## 2021-12-22 NOTE — Telephone Encounter (Signed)
Denied today °Your request has been denied ° ° °No reasoning given  °

## 2021-12-23 NOTE — Telephone Encounter (Signed)
Candice Jackson (Key: [id number]) Nexletol 180MG  tablets     Status: PA Request  Re-newed and re-sent to plan  With added information

## 2021-12-25 NOTE — Telephone Encounter (Signed)
I called [employer] and discussed PA with pharmacy coverage dept  °She states that even though the PA looks cancelled in CMM - it is still PENDING on their end.  °We sent additional info in on 12/23/21 and it is now under Provider Courtesy  Review.  °There will be a final answer to coverage on/by 12/30/2021. ° °

## 2021-12-29 NOTE — Telephone Encounter (Signed)
PA approved 12/21/21-12/20/22. Patient and pharmacy aware.

## 2022-01-06 ENCOUNTER — Encounter: Payer: Self-pay | Admitting: *Deleted

## 2022-02-24 ENCOUNTER — Encounter: Payer: Self-pay | Admitting: Nurse Practitioner

## 2022-02-24 ENCOUNTER — Ambulatory Visit: Payer: BC Managed Care – PPO | Admitting: Nurse Practitioner

## 2022-02-24 VITALS — BP 148/90 | HR 76 | Temp 98.7°F | Ht 61.0 in | Wt 125.0 lb

## 2022-02-24 DIAGNOSIS — H1033 Unspecified acute conjunctivitis, bilateral: Secondary | ICD-10-CM

## 2022-02-24 MED ORDER — BACITRACIN-POLYMYXIN B 500-10000 UNIT/GM OP OINT: 1.0000 "application " | TOPICAL_OINTMENT | Freq: Two times a day (BID) | OPHTHALMIC | 0 refills | Status: DC

## 2022-02-24 NOTE — Progress Notes (Signed)
? ?Acute Office Visit ? ?Subjective:  ? ? Patient ID: Candice Jackson, female    DOB: [birth year], 56 y.o.   MRN: [id number] ? ?Chief Complaint  ?Patient presents with  ? Conjunctivitis  ? ? ?Conjunctivitis  ?The current episode started 3 to 5 days ago. The onset was gradual. The problem has been gradually worsening. The problem is moderate. Nothing relieves the symptoms. Associated symptoms include eye itching, eye discharge, eye pain and eye redness. Pertinent negatives include no fever, no decreased vision, no double vision, no photophobia and no ear discharge.  ? ? ?Past Medical History:  ?Diagnosis Date  ? Allergic rhinitis   ? Allergy   ? Chronic bronchitis ([place])   ? Constipation   ? GERD (gastroesophageal reflux disease)   ? Hemorrhoid   ? HTN (hypertension)   ? Hyperlipidemia   ? Hypomagnesemia 01/08/2021  ? Thyroid disease   ? ? ?Past Surgical History:  ?Procedure Laterality Date  ? HEMORROIDECTOMY    ? R ankle surgery    ? TOTAL ABDOMINAL HYSTERECTOMY    ? ? ?Family History  ?Problem Relation Age of Onset  ? Heart disease Father   ? Stroke Father   ? Cancer Mother   ?     brain tumor  ? Hypertension Sister   ? Arthritis Sister   ? Heart attack Brother   ? Hypertension Brother   ? Hypertension Daughter   ? Gout Son   ? Diabetes Brother   ? Hypertension Brother   ? Arthritis Brother   ? Hypertension Brother   ? ? ?Social History  ? ?Socioeconomic History  ? Marital status: Married  ?  Spouse name: [name]  ? Number of children: 2  ? Years of education: Not on file  ? Highest education level: Not on file  ?Occupational History  ? Occupation: Chartered certified accountant  ?  Employer: [employer]  ?Tobacco Use  ? Smoking status: Every Day  ?  Packs/day: 0.80  ?  Years: 20.00  ?  Pack years: 16.00  ?  Types: Cigarettes  ? Smokeless tobacco: Never  ?Vaping Use  ? Vaping Use: Never used  ?Substance and Sexual Activity  ? Alcohol use: Yes  ?  Comment: 3-4 beers a night  ? Drug use: Never  ? Sexual activity: Not on file   ?Other Topics Concern  ? Not on file  ?Social History Narrative  ? Not on file  ? ?Social Determinants of Health  ? ?Financial Resource Strain: Not on file  ?Food Insecurity: Not on file  ?Transportation Needs: Not on file  ?Physical Activity: Not on file  ?Stress: Not on file  ?Social Connections: Not on file  ?Intimate Partner Violence: Not on file  ? ? ?Outpatient Medications Prior to Visit  ?Medication Sig Dispense Refill  ? albuterol (PROAIR HFA) 108 (90 Base) MCG/ACT inhaler Inhale 2 puffs into the lungs every 6 (six) hours as needed. 18 g 11  ? Bempedoic Acid 180 MG TABS Take 180 mg by mouth daily. 30 tablet 2  ? budesonide-formoterol (SYMBICORT) 160-4.5 MCG/ACT inhaler Inhale 2 puffs into the lungs 2 (two) times daily. 1 Inhaler 3  ? cyclobenzaprine (FLEXERIL) 5 MG tablet Take 1 tablet (5 mg total) by mouth 3 (three) times daily as needed for muscle spasms. 90 tablet 1  ? ezetimibe (ZETIA) 10 MG tablet Take 1 tablet (10 mg total) by mouth daily. 90 tablet 1  ? fluticasone (FLONASE) 50 MCG/ACT nasal spray USE 2 SPRAYS  IN EACH NOSTRIL DAILY 48 g 1  ? ibuprofen (ADVIL,MOTRIN) 600 MG tablet Take 600 mg by mouth at bedtime.    ? levothyroxine (SYNTHROID) 75 MCG tablet Take 1 tablet (75 mcg) by mouth daily except one day per week take 1.5 tablets (112.5 mcg). 90 tablet 1  ? Nebivolol HCl 20 MG TABS Take 1 tablet (20 mg total) by mouth daily. 90 tablet 1  ? omeprazole (PRILOSEC) 20 MG capsule Take 1 capsule (20 mg total) by mouth daily. 90 capsule 1  ? ?No facility-administered medications prior to visit.  ? ? ?Allergies  ?Allergen Reactions  ? Cephalexin Palpitations  ? Levofloxacin Hives  ? Amoxicillin-Pot Clavulanate Nausea And Vomiting  ? Doxycycline Nausea And Vomiting  ? Ketek [Telithromycin] Palpitations  ? Statins Rash  ? ? ?Review of Systems  ?Constitutional:  Negative for fever.  ?HENT:  Negative for ear discharge.   ?Eyes:  Positive for pain, discharge, redness and itching. Negative for double vision  and photophobia.  ?All other systems reviewed and are negative. ? ?   ?Objective:  ?  ?Physical Exam ?Vitals and nursing note reviewed.  ?Constitutional:   ?   Appearance: Normal appearance.  ?HENT:  ?   Head: Normocephalic.  ?   Right Ear: External ear normal.  ?   Left Ear: External ear normal.  ?   Nose: Nose normal.  ?   Mouth/Throat:  ?   Mouth: Mucous membranes are moist.  ?   Pharynx: Oropharynx is clear.  ?Eyes:  ?   General:     ?   Right eye: Discharge present.  ?Cardiovascular:  ?   Rate and Rhythm: Normal rate and regular rhythm.  ?   Pulses: Normal pulses.  ?   Heart sounds: Normal heart sounds.  ?Pulmonary:  ?   Effort: Pulmonary effort is normal.  ?   Breath sounds: Normal breath sounds.  ?Abdominal:  ?   General: Bowel sounds are normal.  ?Skin: ?   Findings: No rash.  ?Neurological:  ?   Mental Status: She is alert.  ?Psychiatric:     ?   Behavior: Behavior normal.  ? ? ?BP (!) 148/90   Pulse 76   Temp 98.7 ?F (37.1 ?C)   Ht 5' 1"  (1.549 m)   Wt 125 lb (56.7 kg)   SpO2 98%   BMI 23.62 kg/m?  ?Wt Readings from Last 3 Encounters:  ?02/24/22 125 lb (56.7 kg)  ?12/09/21 127 lb 12.8 oz (58 kg)  ?07/09/21 129 lb 3.2 oz (58.6 kg)  ? ? ?Health Maintenance Due  ?Topic Date Due  ? COVID-19 Vaccine (3 - Booster for Moderna series) 11/25/2020  ? ? ?There are no preventive care reminders to display for this patient. ? ? ?Lab Results  ?Component Value Date  ? TSH 2.570 12/09/2021  ? ?Lab Results  ?Component Value Date  ? WBC 5.9 12/09/2021  ? HGB 14.1 12/09/2021  ? HCT 39.2 12/09/2021  ? MCV 90 12/09/2021  ? PLT 220 12/09/2021  ? ?Lab Results  ?Component Value Date  ? NA 136 12/09/2021  ? K 4.2 12/09/2021  ? CO2 22 12/09/2021  ? GLUCOSE 82 12/09/2021  ? BUN 5 (L) 12/09/2021  ? CREATININE 0.65 12/09/2021  ? BILITOT 0.3 12/09/2021  ? ALKPHOS 80 12/09/2021  ? AST 22 12/09/2021  ? ALT 20 12/09/2021  ? PROT 7.0 12/09/2021  ? ALBUMIN 4.5 12/09/2021  ? CALCIUM 9.2 12/09/2021  ? EGFR 103 12/09/2021  ? ?  Lab Results   ?Component Value Date  ? CHOL 273 (H) 12/09/2021  ? ?Lab Results  ?Component Value Date  ? HDL 44 12/09/2021  ? ?Lab Results  ?Component Value Date  ? LDLCALC 161 (H) 12/09/2021  ? ?Lab Results  ?Component Value Date  ? TRIG 359 (H) 12/09/2021  ? ?Lab Results  ?Component Value Date  ? CHOLHDL 6.2 (H) 12/09/2021  ? ?No results found for: HGBA1C ? ?   ?Assessment & Plan:  ? ?Patient presents  with bacterial conjunctivitis of the right eye ?-warm compress to clean eye ?-touch eyes with only clean hands ?- use medication (polysporin ) as prescribed ?-follow up with worsening unresolved symptoms ? ? ?Problem List Items Addressed This Visit   ?None ?Visit Diagnoses   ? ? Acute bacterial conjunctivitis of right eyes    -  Primary  ? Relevant Medications  ? bacitracin-polymyxin b (POLYSPORIN) ophthalmic ointment  ? ?  ? ? ? ?Meds ordered this encounter  ?Medications  ? bacitracin-polymyxin b (POLYSPORIN) ophthalmic ointment  ?  Sig: Place 1 application. into the right eye every 12 (twelve) hours. apply to eye every 12 hours while awake  ?  Dispense:  3.5 g  ?  Refill:  0  ?  Order Specific Question:   Supervising Provider  ?  AnswerClaretta Fraise [[id number]]  ? ? ? ?Ivy Lynn, NP ? ?

## 2022-02-24 NOTE — Patient Instructions (Signed)
Bacterial Conjunctivitis, Adult °Bacterial conjunctivitis is an infection of the clear membrane that covers the white part of the eye and the inner surface of the eyelid (conjunctiva). When the blood vessels in the conjunctiva become inflamed, the eye becomes red or pink. The eye often feels irritated or itchy. Bacterial conjunctivitis spreads easily from person to person (is contagious). It also spreads easily from one eye to the other eye. °What are the causes? °This condition is caused by bacteria. You may get the infection if you come into close contact with: °A person who is infected with the bacteria. °Items that are contaminated with the bacteria, such as a face towel, contact lens solution, or eye makeup. °What increases the risk? °You are more likely to develop this condition if: °You are exposed to other people who have the infection. °You wear contact lenses. °You have a sinus infection. °You have had a recent eye injury or surgery. °You have a weak body defense system (immune system). °You have a medical condition that causes dry eyes. °What are the signs or symptoms? °Symptoms of this condition include: °Thick, yellowish discharge from the eye. This may turn into a crust on the eyelid overnight and cause your eyelids to stick together. °Tearing or watery eyes. °Itchy eyes. °Burning feeling in your eyes. °Eye redness. °Swollen eyelids. °Blurred vision. °How is this diagnosed? °This condition is diagnosed based on your symptoms and medical history. Your health care provider may also take a sample of discharge from your eye to find the cause of your infection. °How is this treated? °This condition may be treated with: °Antibiotic eye drops or ointment to clear the infection more quickly and prevent the spread of infection to others. °Antibiotic medicines taken by mouth (orally) to treat infections that do not respond to drops or ointments or that last longer than 10 days. °Cool, wet cloths (cool  compresses) placed on the eyes. °Artificial tears applied 2-6 times a day. °Follow these instructions at home: °Medicines °Take or apply your antibiotic medicine as told by your health care provider. Do not stop using the antibiotic, even if your condition improves, unless directed by your health care provider. °Take or apply over-the-counter and prescription medicines only as told by your health care provider. °Be very careful to avoid touching the edge of your eyelid with the eye-drop bottle or the ointment tube when you apply medicines to the affected eye. This will keep you from spreading the infection to your other eye or to other people. °Managing discomfort °Gently wipe away any drainage from your eye with a warm, wet washcloth or a cotton ball. °Apply a clean, cool compress to your eye for 10-20 minutes, 3-4 times a day. °General instructions °Do not wear contact lenses until the inflammation is gone and your health care provider says it is safe to wear them again. Ask your health care provider how to sterilize or replace your contact lenses before you use them again. Wear glasses until you can resume wearing contact lenses. °Avoid wearing eye makeup until the inflammation is gone. Throw away any old eye cosmetics that may be contaminated. °Change or wash your pillowcase every day. °Do not share towels or washcloths. This may spread the infection. °Wash your hands often with soap and water for at least 20 seconds and especially before touching your face or eyes. Use paper towels to dry your hands. °Avoid touching or rubbing your eyes. °Do not drive or use heavy machinery if your vision is blurred. °Contact   a health care provider if: °You have a fever. °Your symptoms do not get better after 10 days. °Get help right away if: °You have a fever and your symptoms suddenly get worse. °You have severe pain when you move your eye. °You have facial pain, redness, or swelling. °You have a sudden loss of  vision. °Summary °Bacterial conjunctivitis is an infection of the clear membrane that covers the white part of the eye and the inner surface of the eyelid (conjunctiva). °Bacterial conjunctivitis spreads easily from eye to eye and from person to person (is contagious). °Wash your hands often with soap and water for at least 20 seconds and especially before touching your face or eyes. Use paper towels to dry your hands. °Take or apply your antibiotic medicine as told by your health care provider. Do not stop using the antibiotic even if your condition improves. °Contact a health care provider if you have a fever or if your symptoms do not get better after 10 days. Get help right away if you have a sudden loss of vision. °This information is not intended to replace advice given to you by your health care provider. Make sure you discuss any questions you have with your health care provider. °Document Revised: 02/25/2021 Document Reviewed: 02/25/2021 °Elsevier Patient Education © 2022 [employer] ° °

## 2022-02-28 ENCOUNTER — Other Ambulatory Visit: Payer: Self-pay | Admitting: Family Medicine

## 2022-02-28 DIAGNOSIS — K219 Gastro-esophageal reflux disease without esophagitis: Secondary | ICD-10-CM

## 2022-02-28 DIAGNOSIS — E782 Mixed hyperlipidemia: Secondary | ICD-10-CM

## 2022-02-28 DIAGNOSIS — I10 Essential (primary) hypertension: Secondary | ICD-10-CM

## 2022-03-01 ENCOUNTER — Telehealth: Payer: Self-pay | Admitting: Nurse Practitioner

## 2022-03-01 ENCOUNTER — Other Ambulatory Visit: Payer: Self-pay | Admitting: Nurse Practitioner

## 2022-03-01 DIAGNOSIS — H1033 Unspecified acute conjunctivitis, bilateral: Secondary | ICD-10-CM

## 2022-03-01 MED ORDER — BACITRACIN-POLYMYXIN B 500-10000 UNIT/GM OP OINT: 1.0000 "application " | TOPICAL_OINTMENT | Freq: Two times a day (BID) | OPHTHALMIC | 1 refills | Status: DC

## 2022-03-01 NOTE — Telephone Encounter (Signed)
Seen JA please advise  ?

## 2022-03-01 NOTE — Telephone Encounter (Signed)
Please give patient extended work note, I will send medication to pharmacy, it should be used at least up to 7 days. ?

## 2022-03-01 NOTE — Telephone Encounter (Signed)
Pt called stating that she was seen/treated last week for pink eye. Pt says at that time she only had it in one eye, but now has it in both. Pt says she has been using the cream that was prescribed to her but says its not working. Wants to know if eye drops can be prescribed to her? ? ?Pt also says that she was supposed to go back to work today but she cant now that she has pink eye in both eyes and symptoms are no better. Needs work note extended.  ?

## 2022-03-02 NOTE — Telephone Encounter (Signed)
Contacted patient. She is going back to work Architectural technologist.  She does not need a new note. ?

## 2022-03-09 ENCOUNTER — Ambulatory Visit: Payer: BC Managed Care – PPO | Admitting: Family Medicine

## 2022-03-12 ENCOUNTER — Ambulatory Visit: Payer: BC Managed Care – PPO | Admitting: Family Medicine

## 2022-03-23 ENCOUNTER — Ambulatory Visit: Payer: BC Managed Care – PPO | Admitting: Family Medicine

## 2022-04-09 ENCOUNTER — Encounter: Payer: Self-pay | Admitting: Family Medicine

## 2022-04-09 ENCOUNTER — Ambulatory Visit: Payer: BC Managed Care – PPO | Admitting: Family Medicine

## 2022-04-09 VITALS — BP 160/99 | HR 71 | Temp 97.9°F | Ht 61.0 in | Wt 125.4 lb

## 2022-04-09 DIAGNOSIS — I10 Essential (primary) hypertension: Secondary | ICD-10-CM

## 2022-04-09 DIAGNOSIS — J449 Chronic obstructive pulmonary disease, unspecified: Secondary | ICD-10-CM

## 2022-04-09 DIAGNOSIS — H1010 Acute atopic conjunctivitis, unspecified eye: Secondary | ICD-10-CM

## 2022-04-09 DIAGNOSIS — E782 Mixed hyperlipidemia: Secondary | ICD-10-CM

## 2022-04-09 DIAGNOSIS — E039 Hypothyroidism, unspecified: Secondary | ICD-10-CM | POA: Diagnosis not present

## 2022-04-09 DIAGNOSIS — R1012 Left upper quadrant pain: Secondary | ICD-10-CM

## 2022-04-09 DIAGNOSIS — K219 Gastro-esophageal reflux disease without esophagitis: Secondary | ICD-10-CM

## 2022-04-09 MED ORDER — OLOPATADINE HCL 0.2 % OP SOLN: 1.0000 [drp] | Freq: Every day | OPHTHALMIC | 2 refills | Status: AC

## 2022-04-09 MED ORDER — LEVOCETIRIZINE DIHYDROCHLORIDE 5 MG PO TABS: 5.0000 mg | ORAL_TABLET | Freq: Every evening | ORAL | 2 refills | Status: DC

## 2022-04-09 MED ORDER — LOSARTAN POTASSIUM 50 MG PO TABS: 50.0000 mg | ORAL_TABLET | Freq: Every day | ORAL | 2 refills | Status: DC

## 2022-04-09 NOTE — Progress Notes (Signed)
? ?Assessment & Plan:  ?1. Essential (primary) hypertension ?Uncontrolled.  Adding losartan 50 mg once daily. ?- losartan (COZAAR) 50 MG tablet; Take 1 tablet (50 mg total) by mouth daily.  Dispense: 30 tablet; Refill: 2 ?- CBC with Differential/Platelet ?- CMP14+EGFR ?- Lipid panel ? ?2. Mixed hyperlipidemia ?Previously uncontrolled.  Patient is going to try bempedoic acid again as flushing and increased blood pressure are not listed side effects of the medication.  However we did discuss if this occurs again she can stop the medication. ?- CBC with Differential/Platelet ?- CMP14+EGFR ?- Lipid panel ? ?3. Acquired hypothyroidism ?Well controlled on current regimen.  ?- TSH ?- T4, free ? ?4. COPD without exacerbation ([place]) ?Well controlled on current regimen.  ? ?5. Gastroesophageal reflux disease without esophagitis ?Well controlled on current regimen.  ?- Lipid panel ? ?6. Seasonal allergic conjunctivitis ?Uncontrolled.  Started Xyzal at bedtime and Pataday eyedrops. ?- levocetirizine (XYZAL) 5 MG tablet; Take 1 tablet (5 mg total) by mouth every evening.  Dispense: 30 tablet; Refill: 2 ?- Olopatadine HCl 0.2 % SOLN; Apply 1 drop to eye daily.  Dispense: 2.5 mL; Refill: 2 ? ?7. Left upper quadrant abdominal pain ?Encouraged patient to take ibuprofen 3 times a day for the next several days to control her pain. ? ? ?Return in about 6 weeks (around 05/21/2022) for HTN. ? ?Hendricks Limes, MSN, APRN, FNP-C ?[place] ? ?Subjective:  ? ? Patient ID: Candice Jackson, female    DOB: 1965/12/26, 56 y.o.   MRN: [id number] ? ?Patient Care Team: ?Loman Brooklyn, FNP as PCP - General (Family Medicine)  ? ?Chief Complaint:  ?Chief Complaint  ?Patient presents with  ? Medical Management of Chronic Issues  ? Conjunctivitis  ?  Patient was seen on 3/29 and states her eyes still hurt and are watering. ?  ? Flank Pain  ?  Left x 4-5 weeks  ? ? ?HPI: ?Candice Jackson is a 56 y.o. female presenting on  04/09/2022 for Medical Management of Chronic Issues, Conjunctivitis (Patient was seen on 3/29 and states her eyes still hurt and are watering./), and Flank Pain (Left x 4-5 weeks) ? ?Hypertension: Previously controlled with Nebivolol 20 mg daily, but she is high today. She states at home she has also been running high.  ?  ?Hypothyroidism: taking levothyroxine daily. Dose was increased to 75 mcg daily with 112.5 mcg on Wednesdays after her lab work in October.  ?  ?COPD: controlled with daily Symbicort and albuterol as needed. Patient reports she rarely requires her Albuterol inhaler. ?  ?GERD: controlled with omeprazole. ?  ?Hyperlipidemia: taking Zetia daily, unable to tolerate statins. She tried bempedoic acid after her last labs but reports he made her face feel flush and her blood pressure increased. She took it for a month before stopping. ? ?New complaints: ?Patient reports both eyes feel aggravated and they are stuck together in the morning. Drainage is watery. She does have allergy symptoms of sneezing and runny nose as well.  This has been going on since the end of March. ? ?Patient also reports pain in the left side of her upper abdomen and that started with a coughing episode.  There is no pain when she is sitting, but she feels it when she is up walking.  It has been present for several weeks now.  She takes ibuprofen 400 mg at bedtime, which is effective. ? ? ?Social history: ? ?Relevant past medical, surgical, family and social history  reviewed and updated as indicated. Interim medical history since our last visit reviewed. ? ?Allergies and medications reviewed and updated. ? ?DATA REVIEWED: CHART IN EPIC ? ?ROS: Negative unless specifically indicated above in HPI.  ? ? ?Current Outpatient Medications:  ?  albuterol (PROAIR HFA) 108 (90 Base) MCG/ACT inhaler, Inhale 2 puffs into the lungs every 6 (six) hours as needed., Disp: 18 g, Rfl: 11 ?  budesonide-formoterol (SYMBICORT) 160-4.5 MCG/ACT inhaler,  Inhale 2 puffs into the lungs 2 (two) times daily., Disp: 1 Inhaler, Rfl: 3 ?  cyclobenzaprine (FLEXERIL) 5 MG tablet, Take 1 tablet (5 mg total) by mouth 3 (three) times daily as needed for muscle spasms., Disp: 90 tablet, Rfl: 1 ?  ezetimibe (ZETIA) 10 MG tablet, TAKE 1 TABLET BY MOUTH EVERY DAY, Disp: 90 tablet, Rfl: 0 ?  fluticasone (FLONASE) 50 MCG/ACT nasal spray, USE 2 SPRAYS IN EACH NOSTRIL DAILY, Disp: 48 g, Rfl: 1 ?  ibuprofen (ADVIL,MOTRIN) 600 MG tablet, Take 600 mg by mouth at bedtime., Disp: , Rfl:  ?  levothyroxine (SYNTHROID) 75 MCG tablet, Take 1 tablet (75 mcg) by mouth daily except one day per week take 1.5 tablets (112.5 mcg)., Disp: 90 tablet, Rfl: 1 ?  Nebivolol HCl 20 MG TABS, TAKE 1 TABLET BY MOUTH EVERY DAY, Disp: 90 tablet, Rfl: 0 ?  omeprazole (PRILOSEC) 20 MG capsule, TAKE 1 CAPSULE BY MOUTH EVERY DAY, Disp: 90 capsule, Rfl: 0 ?  Bempedoic Acid 180 MG TABS, Take 180 mg by mouth daily., Disp: 30 tablet, Rfl: 2  ? ?Allergies  ?Allergen Reactions  ? Cephalexin Palpitations  ? Levofloxacin Hives  ? Amoxicillin-Pot Clavulanate Nausea And Vomiting  ? Doxycycline Nausea And Vomiting  ? Ketek [Telithromycin] Palpitations  ? Statins Rash  ? ?Past Medical History:  ?Diagnosis Date  ? Allergic rhinitis   ? Allergy   ? Chronic bronchitis ([place])   ? Constipation   ? GERD (gastroesophageal reflux disease)   ? Hemorrhoid   ? HTN (hypertension)   ? Hyperlipidemia   ? Hypomagnesemia 01/08/2021  ? Thyroid disease   ?  ?Past Surgical History:  ?Procedure Laterality Date  ? HEMORROIDECTOMY    ? R ankle surgery    ? TOTAL ABDOMINAL HYSTERECTOMY    ?  ?Social History  ? ?Socioeconomic History  ? Marital status: Married  ?  Spouse name: [name]  ? Number of children: 2  ? Years of education: Not on file  ? Highest education level: Not on file  ?Occupational History  ? Occupation: Chartered certified accountant  ?  Employer: [employer]  ?Tobacco Use  ? Smoking status: Every Day  ?  Packs/day: 0.80  ?  Years: 20.00  ?   Pack years: 16.00  ?  Types: Cigarettes  ? Smokeless tobacco: Never  ?Vaping Use  ? Vaping Use: Never used  ?Substance and Sexual Activity  ? Alcohol use: Yes  ?  Comment: 3-4 beers a night  ? Drug use: Never  ? Sexual activity: Not on file  ?Other Topics Concern  ? Not on file  ?Social History Narrative  ? Not on file  ? ?Social Determinants of Health  ? ?Financial Resource Strain: Not on file  ?Food Insecurity: Not on file  ?Transportation Needs: Not on file  ?Physical Activity: Not on file  ?Stress: Not on file  ?Social Connections: Not on file  ?Intimate Partner Violence: Not on file  ?  ? ?   ?Objective:  ?  ?BP (!) 160/99  Pulse 71   Temp 97.9 ?F (36.6 ?C) (Temporal)   Ht _0  (1.549 m)   Wt 125 lb 6.4 oz (56.9 kg)   SpO2 98%   BMI 23.69 kg/m?  ? ?Wt Readings from Last 3 Encounters:  ?04/09/22 125 lb 6.4 oz (56.9 kg)  ?02/24/22 125 lb (56.7 kg)  ?12/09/21 127 lb 12.8 oz (58 kg)  ? ? ?Physical Exam ?Vitals reviewed.  ?Constitutional:   ?   General: She is not in acute distress. ?   Appearance: Normal appearance. She is normal weight. She is not ill-appearing, toxic-appearing or diaphoretic.  ?HENT:  ?   Head: Normocephalic and atraumatic.  ?Eyes:  ?   General: No scleral icterus.    ?   Right eye: Discharge present.     ?   Left eye: Discharge present. ?   Conjunctiva/sclera:  ?   Right eye: Right conjunctiva is injected.  ?   Left eye: Left conjunctiva is injected.  ?Cardiovascular:  ?   Rate and Rhythm: Normal rate and regular rhythm.  ?   Heart sounds: Normal heart sounds. No murmur heard. ?  No friction rub. No gallop.  ?Pulmonary:  ?   Effort: Pulmonary effort is normal. No respiratory distress.  ?   Breath sounds: Normal breath sounds. No stridor. No wheezing, rhonchi or rales.  ?Abdominal:  ?   General: Abdomen is flat. Bowel sounds are normal. There is no distension or abdominal bruit. There are no signs of injury.  ?   Palpations: Abdomen is soft. There is no shifting dullness, fluid wave,  hepatomegaly, splenomegaly, mass or pulsatile mass.  ?   Tenderness: There is abdominal tenderness in the left upper quadrant.  ?Musculoskeletal:     ?   General: Normal range of motion.  ?   Cervical back: Normal rang

## 2022-04-09 NOTE — Patient Instructions (Signed)
Username: Homer ?Password: [email or web address] ?

## 2022-04-10 LAB — CMP14+EGFR
ALT: 22 IU/L (ref 0–32)
AST: 21 IU/L (ref 0–40)
Albumin/Globulin Ratio: 1.9 (ref 1.2–2.2)
Albumin: 4.8 g/dL (ref 3.8–4.9)
Alkaline Phosphatase: 76 IU/L (ref 44–121)
BUN/Creatinine Ratio: 7 — ABNORMAL LOW (ref 9–23)
BUN: 5 mg/dL — ABNORMAL LOW (ref 6–24)
Bilirubin Total: 0.4 mg/dL (ref 0.0–1.2)
CO2: 21 mmol/L (ref 20–29)
Calcium: 9.4 mg/dL (ref 8.7–10.2)
Chloride: 100 mmol/L (ref 96–106)
Creatinine, Ser: 0.72 mg/dL (ref 0.57–1.00)
Globulin, Total: 2.5 g/dL (ref 1.5–4.5)
Glucose: 78 mg/dL (ref 70–99)
Potassium: 4.6 mmol/L (ref 3.5–5.2)
Sodium: 138 mmol/L (ref 134–144)
Total Protein: 7.3 g/dL (ref 6.0–8.5)
eGFR: 97 mL/min/{1.73_m2} (ref >59)

## 2022-04-10 LAB — CBC WITH DIFFERENTIAL/PLATELET
Basophils Absolute: 0.1 10*3/uL (ref 0.0–0.2)
Basos: 1 %
EOS (ABSOLUTE): 0.3 10*3/uL (ref 0.0–0.4)
Eos: 4 %
Hematocrit: 41.2 % (ref 34.0–46.6)
Hemoglobin: 14.4 g/dL (ref 11.1–15.9)
Immature Grans (Abs): 0 10*3/uL (ref 0.0–0.1)
Immature Granulocytes: 0 %
Lymphocytes Absolute: 3.1 10*3/uL (ref 0.7–3.1)
Lymphs: 44 %
MCH: 32.1 pg (ref 26.6–33.0)
MCHC: 35 g/dL (ref 31.5–35.7)
MCV: 92 fL (ref 79–97)
Monocytes Absolute: 0.5 10*3/uL (ref 0.1–0.9)
Monocytes: 7 %
Neutrophils Absolute: 3.1 10*3/uL (ref 1.4–7.0)
Neutrophils: 44 %
Platelets: 253 10*3/uL (ref 150–450)
RBC: 4.49 x10E6/uL (ref 3.77–5.28)
RDW: 12.4 % (ref 11.7–15.4)
WBC: 7.1 10*3/uL (ref 3.4–10.8)

## 2022-04-10 LAB — T4, FREE: Free T4: 1.4 ng/dL (ref 0.82–1.77)

## 2022-04-10 LAB — LIPID PANEL
Chol/HDL Ratio: 5.2 ratio — ABNORMAL HIGH (ref 0.0–4.4)
Cholesterol, Total: 236 mg/dL — ABNORMAL HIGH (ref 100–199)
HDL: 45 mg/dL (ref >39)
LDL Chol Calc (NIH): 137 mg/dL — ABNORMAL HIGH (ref 0–99)
Triglycerides: 298 mg/dL — ABNORMAL HIGH (ref 0–149)
VLDL Cholesterol Cal: 54 mg/dL — ABNORMAL HIGH (ref 5–40)

## 2022-04-10 LAB — TSH: TSH: 0.676 u[IU]/mL (ref 0.450–4.500)

## 2022-04-24 IMAGING — DX DG ABDOMEN 1V
2 series · 2 of 2 positions shown · non-contrast
Comparison: CT of the abdomen dated 02/13/2019.

CLINICAL DATA: Left lower quadrant pain.

EXAM:
ABDOMEN - 1 VIEW

[abdomen kub (1 of 2)]
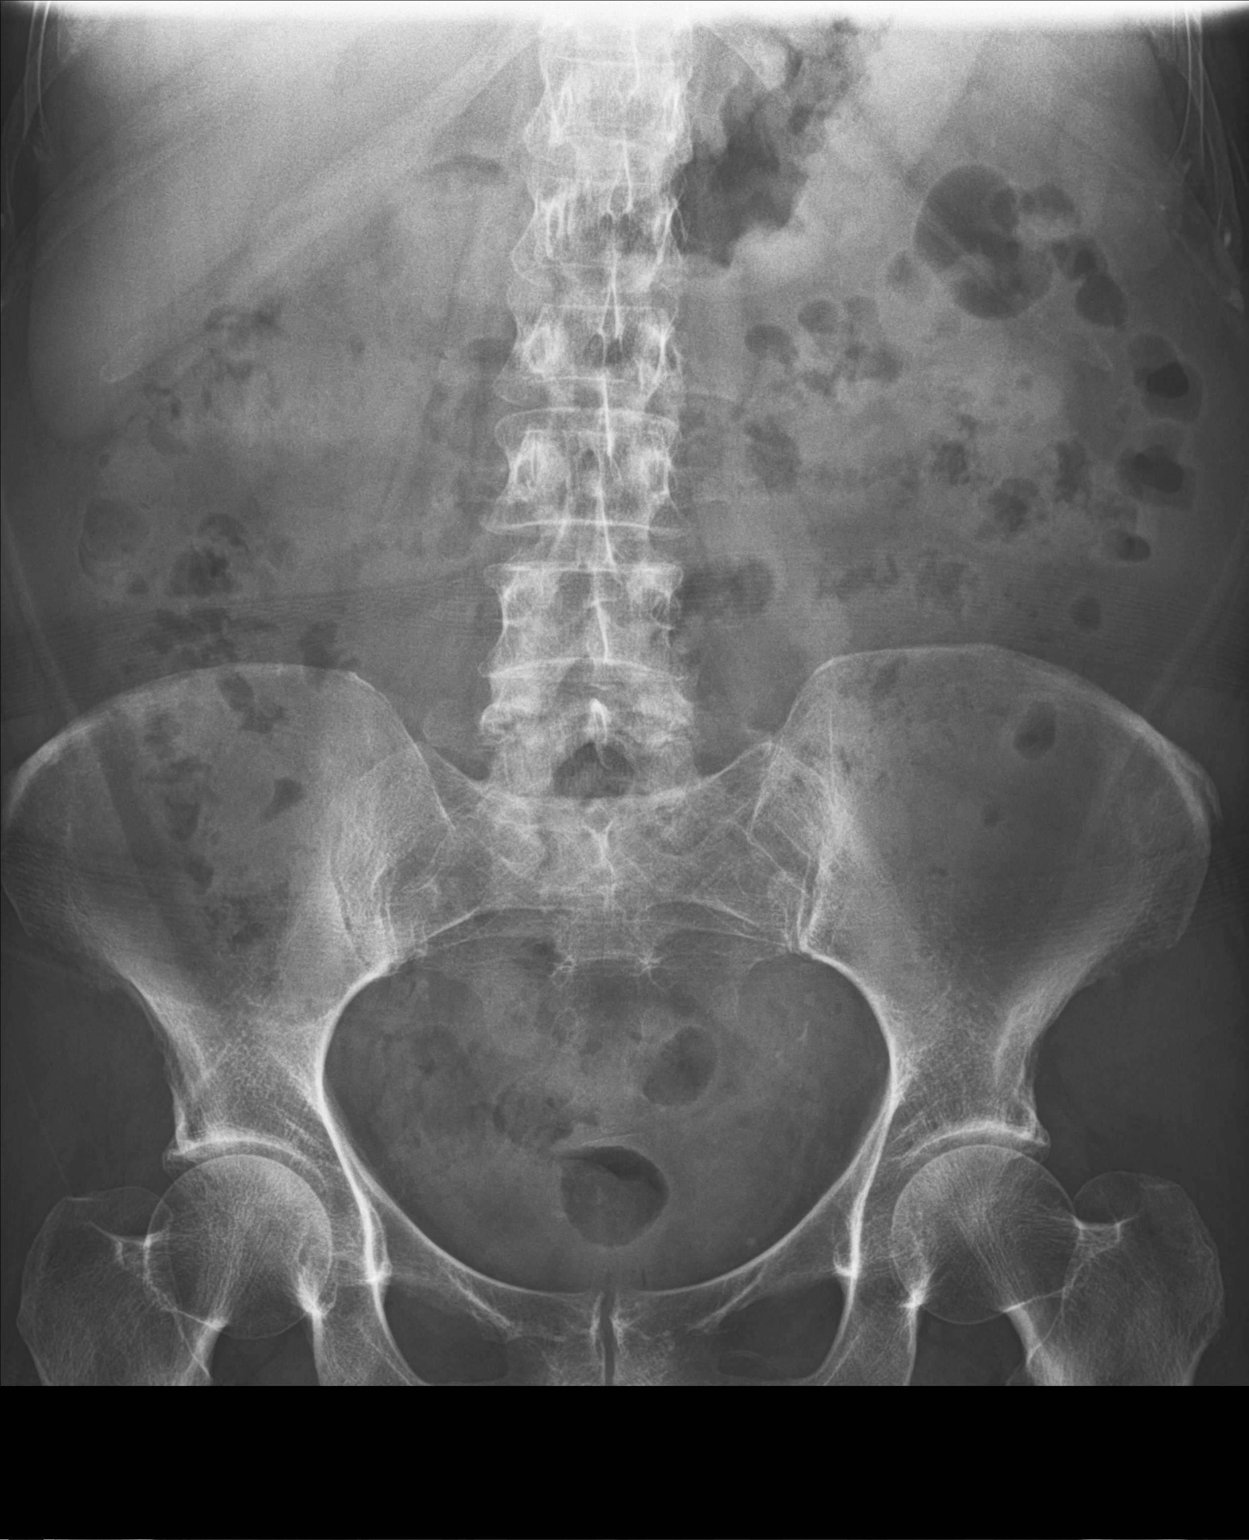

[abdomen kub (2 of 2)]
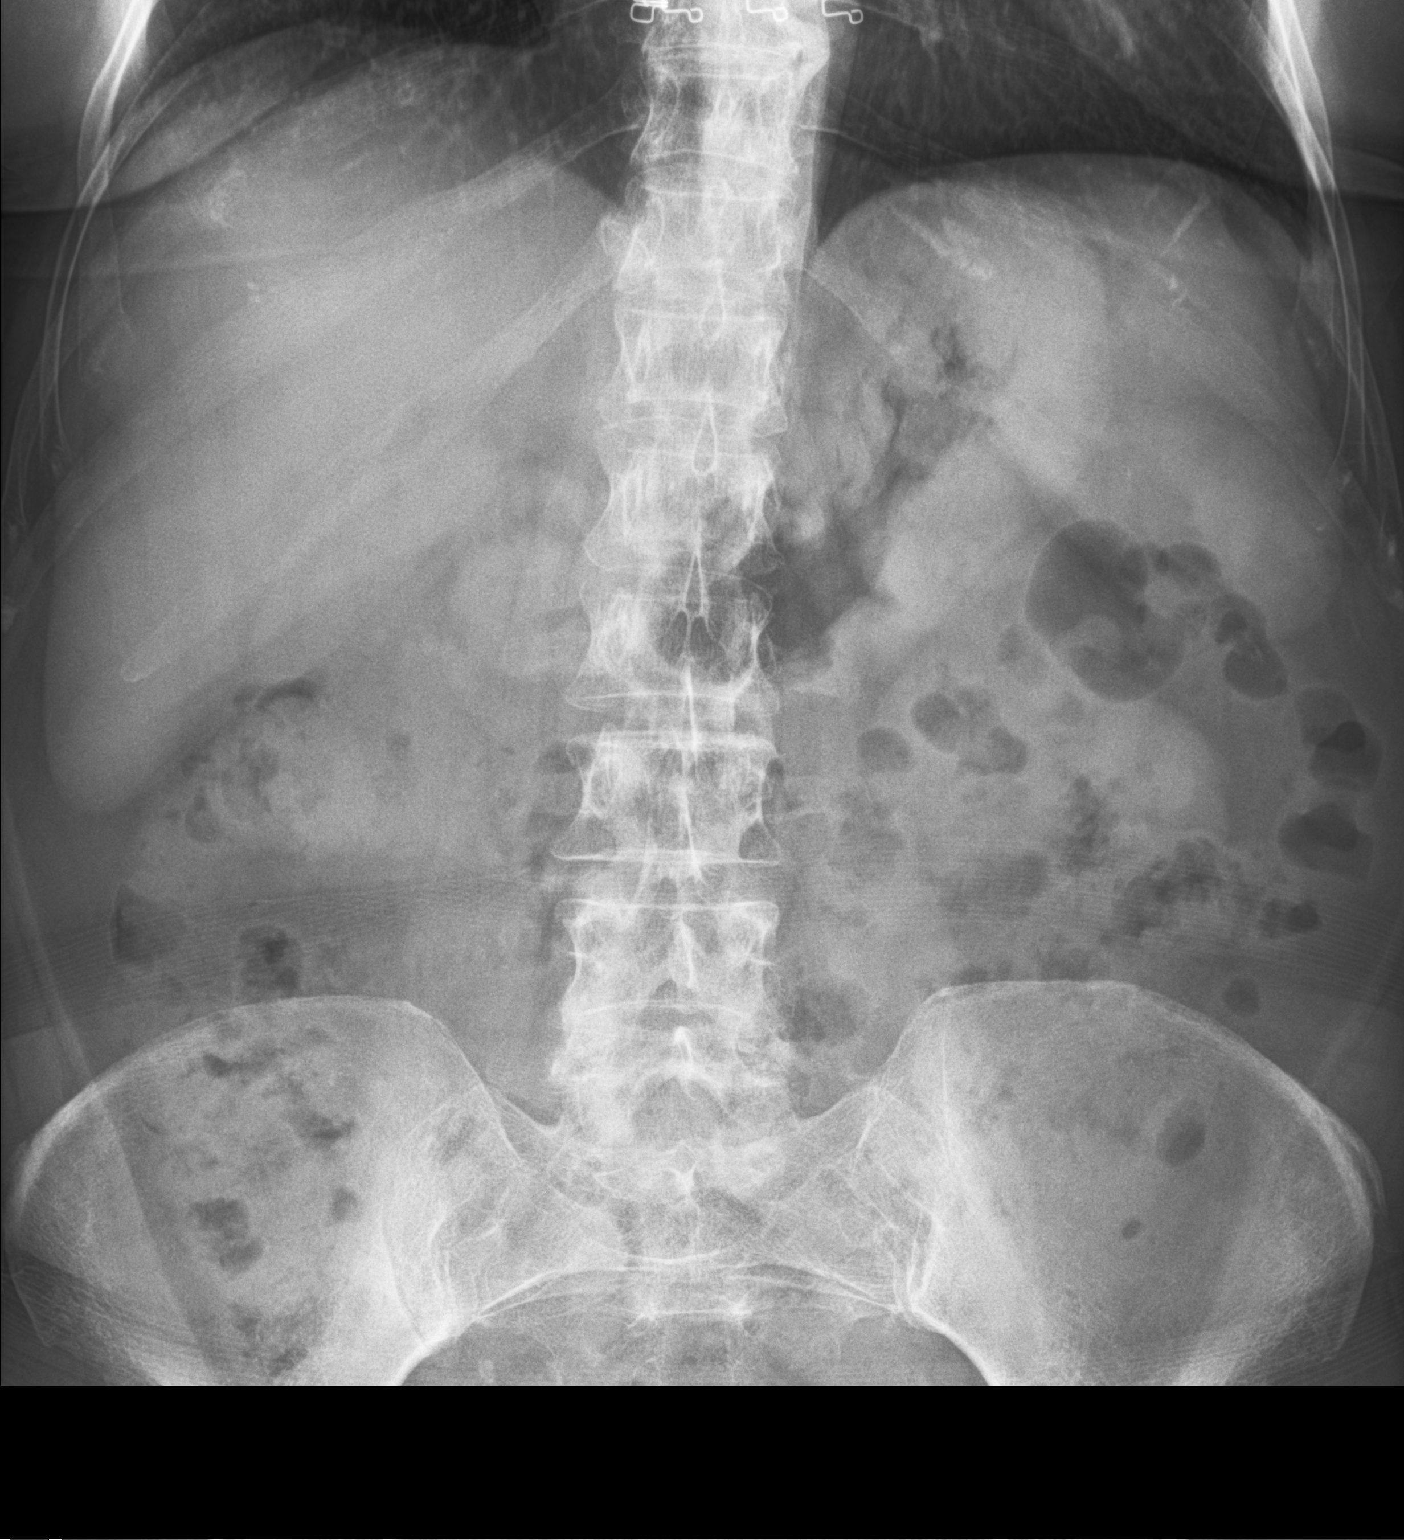

[2 of 2 positions shown; findings below may reference images not displayed]

FINDINGS: No bowel dilatation or evidence of obstruction. No free air or
radiopaque calculi. The osseous structures are intact. The soft
tissues are grossly unremarkable.
IMPRESSION: Negative.

## 2022-05-21 ENCOUNTER — Other Ambulatory Visit: Payer: Self-pay | Admitting: Family Medicine

## 2022-05-21 ENCOUNTER — Ambulatory Visit: Payer: BC Managed Care – PPO | Admitting: Family Medicine

## 2022-05-21 DIAGNOSIS — E039 Hypothyroidism, unspecified: Secondary | ICD-10-CM

## 2022-05-27 ENCOUNTER — Other Ambulatory Visit: Payer: Self-pay | Admitting: Family Medicine

## 2022-05-27 DIAGNOSIS — I10 Essential (primary) hypertension: Secondary | ICD-10-CM

## 2022-05-27 DIAGNOSIS — K219 Gastro-esophageal reflux disease without esophagitis: Secondary | ICD-10-CM

## 2022-05-27 DIAGNOSIS — E782 Mixed hyperlipidemia: Secondary | ICD-10-CM

## 2022-07-04 ENCOUNTER — Other Ambulatory Visit: Payer: Self-pay | Admitting: Family Medicine

## 2022-07-04 DIAGNOSIS — I10 Essential (primary) hypertension: Secondary | ICD-10-CM

## 2022-07-05 ENCOUNTER — Other Ambulatory Visit: Payer: Self-pay | Admitting: Family Medicine

## 2022-07-05 DIAGNOSIS — H1010 Acute atopic conjunctivitis, unspecified eye: Secondary | ICD-10-CM

## 2022-08-22 ENCOUNTER — Other Ambulatory Visit: Payer: Self-pay | Admitting: Family Medicine

## 2022-08-22 DIAGNOSIS — I10 Essential (primary) hypertension: Secondary | ICD-10-CM

## 2022-08-22 DIAGNOSIS — K219 Gastro-esophageal reflux disease without esophagitis: Secondary | ICD-10-CM

## 2022-08-22 DIAGNOSIS — E782 Mixed hyperlipidemia: Secondary | ICD-10-CM

## 2022-09-14 ENCOUNTER — Other Ambulatory Visit: Payer: Self-pay | Admitting: Family Medicine

## 2022-09-14 DIAGNOSIS — K219 Gastro-esophageal reflux disease without esophagitis: Secondary | ICD-10-CM

## 2022-09-14 DIAGNOSIS — I10 Essential (primary) hypertension: Secondary | ICD-10-CM

## 2022-09-14 DIAGNOSIS — E782 Mixed hyperlipidemia: Secondary | ICD-10-CM

## 2022-09-18 ENCOUNTER — Other Ambulatory Visit: Payer: Self-pay | Admitting: Family Medicine

## 2022-09-18 DIAGNOSIS — I10 Essential (primary) hypertension: Secondary | ICD-10-CM

## 2022-09-18 DIAGNOSIS — K219 Gastro-esophageal reflux disease without esophagitis: Secondary | ICD-10-CM

## 2022-09-18 DIAGNOSIS — E782 Mixed hyperlipidemia: Secondary | ICD-10-CM

## 2022-11-13 ENCOUNTER — Other Ambulatory Visit: Payer: Self-pay | Admitting: Family Medicine

## 2022-11-13 DIAGNOSIS — E039 Hypothyroidism, unspecified: Secondary | ICD-10-CM

## 2022-11-28 ENCOUNTER — Other Ambulatory Visit: Payer: Self-pay | Admitting: Family Medicine

## 2022-11-28 DIAGNOSIS — H1010 Acute atopic conjunctivitis, unspecified eye: Secondary | ICD-10-CM

## 2022-11-28 DIAGNOSIS — E782 Mixed hyperlipidemia: Secondary | ICD-10-CM

## 2022-11-28 DIAGNOSIS — Z789 Other specified health status: Secondary | ICD-10-CM

## 2022-12-04 ENCOUNTER — Other Ambulatory Visit: Payer: Self-pay | Admitting: Family Medicine

## 2022-12-04 DIAGNOSIS — E039 Hypothyroidism, unspecified: Secondary | ICD-10-CM

## 2023-02-26 ENCOUNTER — Other Ambulatory Visit: Payer: Self-pay | Admitting: Family Medicine

## 2023-02-26 DIAGNOSIS — E782 Mixed hyperlipidemia: Secondary | ICD-10-CM

## 2023-02-26 DIAGNOSIS — H1010 Acute atopic conjunctivitis, unspecified eye: Secondary | ICD-10-CM

## 2023-02-26 DIAGNOSIS — Z789 Other specified health status: Secondary | ICD-10-CM
